# Patient Record
Sex: Female | Born: 1965 | Race: White | Hispanic: No | Marital: Single | Smoking: Former smoker
Health system: Southern US, Community
[De-identification: ages and names within clinical notes are randomized; demographics above are authoritative.]

## PROBLEM LIST (undated history)

## (undated) DIAGNOSIS — J45909 Unspecified asthma, uncomplicated: Secondary | ICD-10-CM

## (undated) DIAGNOSIS — E7800 Pure hypercholesterolemia, unspecified: Secondary | ICD-10-CM

## (undated) DIAGNOSIS — R748 Abnormal levels of other serum enzymes: Secondary | ICD-10-CM

## (undated) DIAGNOSIS — I10 Essential (primary) hypertension: Secondary | ICD-10-CM

## (undated) DIAGNOSIS — K760 Fatty (change of) liver, not elsewhere classified: Secondary | ICD-10-CM

## (undated) DIAGNOSIS — G4730 Sleep apnea, unspecified: Secondary | ICD-10-CM

## (undated) DIAGNOSIS — E559 Vitamin D deficiency, unspecified: Secondary | ICD-10-CM

## (undated) HISTORY — DX: Fatty (change of) liver, not elsewhere classified: K76.0

## (undated) HISTORY — DX: Unspecified asthma, uncomplicated: J45.909

## (undated) HISTORY — DX: Abnormal levels of other serum enzymes: R74.8

## (undated) HISTORY — PX: OTHER SURGICAL HISTORY: SHX169

## (undated) HISTORY — DX: Vitamin D deficiency, unspecified: E55.9

## (undated) HISTORY — PX: APPENDECTOMY: SHX54

## (undated) HISTORY — DX: Pure hypercholesterolemia, unspecified: E78.00

## (undated) HISTORY — PX: ABDOMINAL HERNIA REPAIR: SHX539

---

## 2000-03-09 ENCOUNTER — Other Ambulatory Visit: Admission: RE | Admit: 2000-03-09 | Discharge: 2000-03-09 | Payer: Self-pay | Admitting: Obstetrics and Gynecology

## 2000-06-28 ENCOUNTER — Other Ambulatory Visit: Admission: RE | Admit: 2000-06-28 | Discharge: 2000-06-28 | Payer: Self-pay | Admitting: Obstetrics and Gynecology

## 2002-07-24 ENCOUNTER — Other Ambulatory Visit: Admission: RE | Admit: 2002-07-24 | Discharge: 2002-07-24 | Payer: Self-pay | Admitting: Obstetrics and Gynecology

## 2002-12-29 ENCOUNTER — Inpatient Hospital Stay (HOSPITAL_COMMUNITY): Admission: AD | Admit: 2002-12-29 | Discharge: 2003-01-02 | Payer: Self-pay | Admitting: Obstetrics and Gynecology

## 2003-08-19 ENCOUNTER — Other Ambulatory Visit: Admission: RE | Admit: 2003-08-19 | Discharge: 2003-08-19 | Payer: Self-pay | Admitting: Obstetrics and Gynecology

## 2004-08-24 ENCOUNTER — Other Ambulatory Visit: Admission: RE | Admit: 2004-08-24 | Discharge: 2004-08-24 | Payer: Self-pay | Admitting: Obstetrics and Gynecology

## 2005-08-30 ENCOUNTER — Other Ambulatory Visit: Admission: RE | Admit: 2005-08-30 | Discharge: 2005-08-30 | Payer: Self-pay | Admitting: Obstetrics and Gynecology

## 2005-09-08 ENCOUNTER — Encounter: Admission: RE | Admit: 2005-09-08 | Discharge: 2005-09-08 | Payer: Self-pay | Admitting: Obstetrics and Gynecology

## 2008-01-03 ENCOUNTER — Ambulatory Visit (HOSPITAL_COMMUNITY): Admission: RE | Admit: 2008-01-03 | Discharge: 2008-01-03 | Payer: Self-pay | Admitting: Family Medicine

## 2010-01-14 ENCOUNTER — Emergency Department (HOSPITAL_COMMUNITY): Admission: EM | Admit: 2010-01-14 | Discharge: 2010-01-14 | Payer: Self-pay | Admitting: Emergency Medicine
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
## 2011-01-16 LAB — URINALYSIS, ROUTINE W REFLEX MICROSCOPIC
Bilirubin Urine: NEGATIVE
Glucose, UA: NEGATIVE mg/dL
Ketones, ur: NEGATIVE mg/dL
Leukocytes, UA: NEGATIVE
Nitrite: POSITIVE — AB
Specific Gravity, Urine: 1.025 (ref 1.005–1.030)
Urobilinogen, UA: 0.2 mg/dL (ref 0.0–1.0)
pH: 5.5 (ref 5.0–8.0)

## 2011-01-16 LAB — DIFFERENTIAL
Basophils Absolute: 0 10*3/uL (ref 0.0–0.1)
Basophils Relative: 0 % (ref 0–1)
Eosinophils Absolute: 0.2 10*3/uL (ref 0.0–0.7)
Eosinophils Relative: 2 % (ref 0–5)
Lymphocytes Relative: 12 % (ref 12–46)
Lymphs Abs: 1.6 10*3/uL (ref 0.7–4.0)
Monocytes Absolute: 0.5 10*3/uL (ref 0.1–1.0)
Monocytes Relative: 4 % (ref 3–12)
Neutro Abs: 11.1 10*3/uL — ABNORMAL HIGH (ref 1.7–7.7)
Neutrophils Relative %: 83 % — ABNORMAL HIGH (ref 43–77)

## 2011-01-16 LAB — BASIC METABOLIC PANEL
BUN: 13 mg/dL (ref 6–23)
CO2: 28 mEq/L (ref 19–32)
Calcium: 9.4 mg/dL (ref 8.4–10.5)
Chloride: 101 mEq/L (ref 96–112)
Creatinine, Ser: 0.67 mg/dL (ref 0.4–1.2)
GFR calc Af Amer: >60 mL/min (ref >60)
GFR calc non Af Amer: >60 mL/min (ref >60)
Glucose, Bld: 117 mg/dL — ABNORMAL HIGH (ref 70–99)
Potassium: 3.9 mEq/L (ref 3.5–5.1)
Sodium: 137 mEq/L (ref 135–145)

## 2011-01-16 LAB — CBC
HCT: 39 % (ref 36.0–46.0)
Hemoglobin: 13.7 g/dL (ref 12.0–15.0)
MCHC: 35.1 g/dL (ref 30.0–36.0)
MCV: 97.2 fL (ref 78.0–100.0)
Platelets: 278 10*3/uL (ref 150–400)
RBC: 4.01 MIL/uL (ref 3.87–5.11)
RDW: 12.5 % (ref 11.5–15.5)
WBC: 13.4 10*3/uL — ABNORMAL HIGH (ref 4.0–10.5)

## 2011-01-16 LAB — URINE MICROSCOPIC-ADD ON

## 2011-01-16 LAB — SAMPLE TO BLOOD BANK

## 2011-01-16 LAB — PREGNANCY, URINE: Preg Test, Ur: NEGATIVE

## 2011-03-10 NOTE — Discharge Summary (Signed)
NAME:  KIELEY, AKTER                           ACCOUNT NO.:  [id number]   MEDICAL RECORD NO.:  [id number]                   PATIENT TYPE:  INP   LOCATION:  9136                                 FACILITY:  [facility]   PHYSICIAN:  Gerrit Friends. Aldona Bar, M.D.                DATE OF BIRTH:  1965/10/25   DATE OF ADMISSION:  12/29/2002  DATE OF DISCHARGE:  01/02/2003                                 DISCHARGE SUMMARY   DISCHARGE DIAGNOSES:  1. Term pregnancy delivered 8 pound 2 ounce female infant, Apgars 9/9.  2. Blood type A positive.  3. Failed induction of labor.  4. Positive group B Streptococcus antenatally.  5. Preeclampsia.   PROCEDURE:  Primary low transverse cesarean section.   SUMMARY:  This 45 year old gravida 2, para 0, was admitted at term for  induction secondary to elevated blood pressure.  She was seen in the office  on March 2 with some elevation of her blood pressure noted; a nonstress test  at that time was reactive.  On March 5, her blood pressure was more normal,  and nonstress test was reactive.  Induction was scheduled on the evening of  March 8, at which time she was admitted.  Her blood pressure on admission  was 168/90.  Cervix was a dimple 20% presenting part (vertex) -2.  She did  have 2+ edema, 3+ reflexes, and 1 beat of clonus bilaterally.   Cytotec was placed with onset of good contractions.  On the morning of March  9, cervix was still a dimple, but since the patient had some previous  cryotherapy, and the cervix thinned out significantly with the contractions  the patient was having, it was possible to open the cervix and reduce the  scar tissue, and this was carried out without difficulty.  The patient  thereafter rapidly progressed to complete dilatation and began pushing well.  Unfortunately, the baby was OP, and an attempt to rotate manually was not  possible.  An epidural was placed to allow the patient to relax and to offer  pain relief.  This was carried out,  and again manual rotation was successful  from ROP to ROA, and with 30 more minutes of pushing, the head came down  from +1 to +2 station.  An attempt was made to deliver the patient with low  forceps, but no change in station was noted after two efforts with the  forceps.  The effort to deliver by forceps was abandoned, and the patient  was taken to the operating room for a low transverse cesarean section and  was delivered of an 8 pound 2 ounce female infant with good Apgars.   The patient's postpartum course was benign.  Blood pressure improved.  Other  than a fever spike the night of deliver, the temperature became totally  normal and remained afebrile during her hospital course.  On the morning of  March 12, she was ambulating well, tolerating a regular diet well and had  normal bowel and bladder function, was normotensive.  Her wound was clean  and dry.  Discharge hemoglobin done on March 10 was 9.4 with a white count  of 11,800 and platelet count of 189,000.   On the morning of discharge, her staples were removed; her wound was Steri-  Stripped, and she was given all appropriate instructions and discharged.  The baby had some physiologic jaundice being dealt with by the  pediatricians.   DISCHARGE MEDICATIONS:  1. Vitamins 1 a day as long as patient is breast feeding.  2. Ferrous sulfate 300 mg daily.  3. Motrin 600 mg every 6 hours as needed for pain.  4. Tylox 1 to 2 every 4 to 6 hours as needed for severe pain.   FOLLOW UP:  She will return to the office for followup in approximately four  weeks' time or as needed. She still had some peripheral edema and was  counseled on this.   CONDITION ON DISCHARGE:  Improved.                                               Gerrit Friends. Aldona Bar, M.D.    RMW/MEDQ  D:  01/02/2003  T:  01/03/2003  Job:  [id number]

## 2011-03-10 NOTE — Op Note (Signed)
NAME:  Brianna Lopez, MEN                           ACCOUNT NO.:  [id number]   MEDICAL RECORD NO.:  [id number]                   PATIENT TYPE:  INP   LOCATION:  9163                                 FACILITY:  [facility]   PHYSICIAN:  Malva Limes, M.D.                 DATE OF BIRTH:  1966-08-04   DATE OF PROCEDURE:  12/30/2002  DATE OF DISCHARGE:                                 OPERATIVE REPORT   PREOPERATIVE DIAGNOSES:  1. Intrauterine pregnancy at 25 weeks estimated gestational age.  2. Cephalopelvic disproportion.  3. Pregnancy-induced hypertension.   POSTOPERATIVE DIAGNOSES:  1. Intrauterine pregnancy at 29 weeks estimated gestational age.  2. Cephalopelvic disproportion.  3. Pregnancy-induced hypertension.   OPERATION PERFORMED:  Primary low transverse cesarean.   SURGEON:  Malva Limes, M.D.   ANESTHESIA:  Epidural.   ANTIBIOTICS:  Ancef 1 g.   ESTIMATED BLOOD LOSS:  900 ml.   COMPLICATIONS:  None.   SPECIMENS:  None.   FINDINGS:  The patient had normal fallopian tubes, ovaries and uterus.  The  patient delivered one viable white female infant with Apgars 9 at one minute  and 9 at five minutes.  The weight was eight pounds and two ounces.   DESCRIPTION OF PROCEDURE:  The patient was taken to the operating room.  Once an adequate anesthetic level was reached, the patient was prepped with  Hibiclens and draped in the usual fashion for this procedure.  A Foley  catheter had previously been placed.  A Pfannenstiel incision was made.  This was carried down to fascia.  The fascia was entered in the midline,  extended laterally.  The rectus muscles were then dissected from the fascia  with the Bovie.  The rectus muscles were divided in the midline and taken  superiorly and inferiorly.  The parietal peritoneum was entered sharply and  taken superiorly and inferiorly.  The bladder flap was taken down sharply.  A low transverse  uterine incision was made in the midline and  extended  laterally with blunt dissection.  Amniotic sac was entered sharply.  The  fluid was noted to be clear.  The infant was delivered in a vertex  presentation.  On delivery of the head, the oropharynx and mouth were bulb  suctioned.  The remainder of the infant was then delivered.  The cord was  doubly clamped and cut.  The infant handed to the waiting NICU team.  Cord  blood was then obtained.  The placenta was manually removed.  The uterus was  exteriorized.  The uterine cavity was wiped with a wet lap.  The uterine  incision was closed in a single layer of 0 chromic in a running locking  fashion.  The bladder flap was closed using 3-0 chromic in a running  fashion.  The uterus was placed back in the abdominal cavity.  The gutters  were wiped with a  wet lap.  Hemostasis was checked and felt to be adequate.  The parietal peritoneum and the rectus muscles were reapproximated in the  midline using 3-0 chromic in a running fashion.  The fascia was closed using  0 Monocryl suture in a running fashion.  The subcuticular tissue was made  hemostatic with a Bovie.  Stainless steel clips were used to close the skin.  The patient tolerated the procedure well.  She was taken to the recovery  room in stable condition.  Instrument and lap counts were correct times two.                                                Malva Limes, M.D.    MA/MEDQ  D:  12/30/2002  T:  12/30/2002  Job:  [id number]

## 2014-09-16 ENCOUNTER — Other Ambulatory Visit (HOSPITAL_COMMUNITY): Payer: Self-pay | Admitting: Family Medicine

## 2014-09-16 DIAGNOSIS — R7989 Other specified abnormal findings of blood chemistry: Secondary | ICD-10-CM

## 2014-09-16 DIAGNOSIS — R945 Abnormal results of liver function studies: Principal | ICD-10-CM

## 2014-09-23 ENCOUNTER — Ambulatory Visit (HOSPITAL_COMMUNITY): Payer: Self-pay

## 2014-10-26 ENCOUNTER — Other Ambulatory Visit (HOSPITAL_COMMUNITY): Payer: Self-pay

## 2014-10-27 ENCOUNTER — Ambulatory Visit (HOSPITAL_COMMUNITY)
Admission: RE | Admit: 2014-10-27 | Discharge: 2014-10-27 | Disposition: A | Discharge status: Home / Self Care | Payer: BC Managed Care – PPO | Source: Ambulatory Visit | Attending: Family Medicine | Admitting: Family Medicine

## 2014-10-27 DIAGNOSIS — R7989 Other specified abnormal findings of blood chemistry: Secondary | ICD-10-CM | POA: Insufficient documentation

## 2014-10-27 DIAGNOSIS — K760 Fatty (change of) liver, not elsewhere classified: Secondary | ICD-10-CM | POA: Diagnosis not present

## 2014-10-27 DIAGNOSIS — R945 Abnormal results of liver function studies: Secondary | ICD-10-CM

## 2016-09-29 IMAGING — US US ABDOMEN COMPLETE
1 series · 14 of 25 positions shown · non-contrast
Comparison: None.

CLINICAL DATA: 48-year-old female with abnormal LFTs. Initial
encounter.

EXAM:
ULTRASOUND ABDOMEN COMPLETE

[Series 1: us abdomen complete · 0.18mm/px · 14 of 107 slices shown]
[im 1/107]
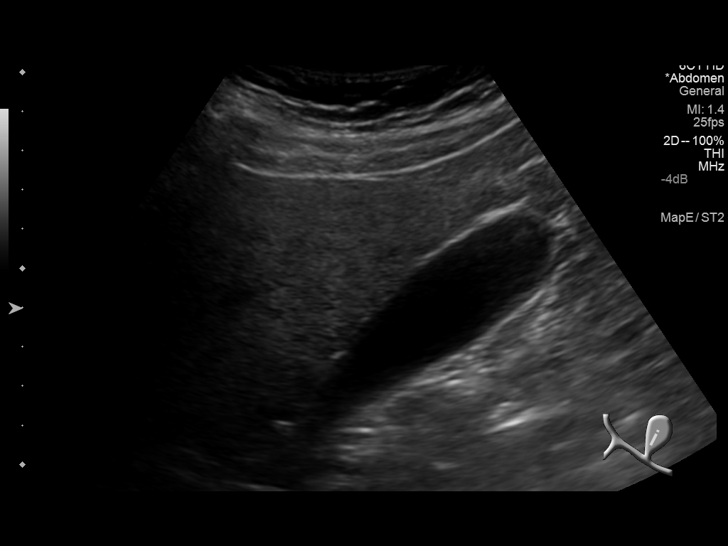
[im 9/107]
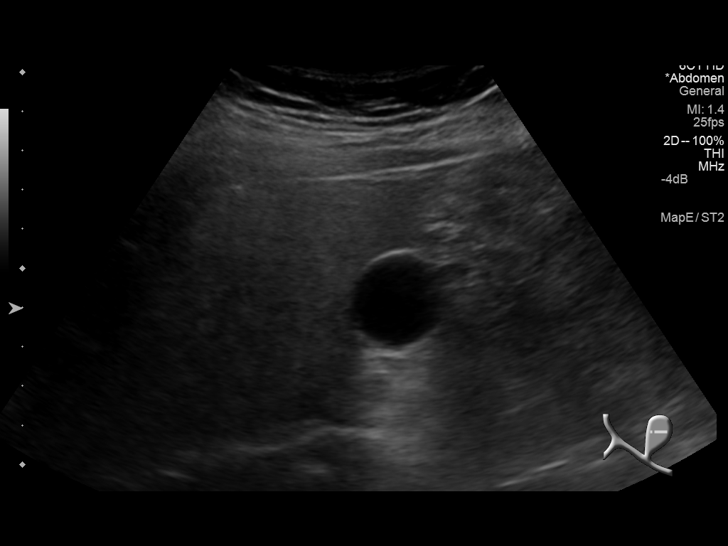
[im 18/107]
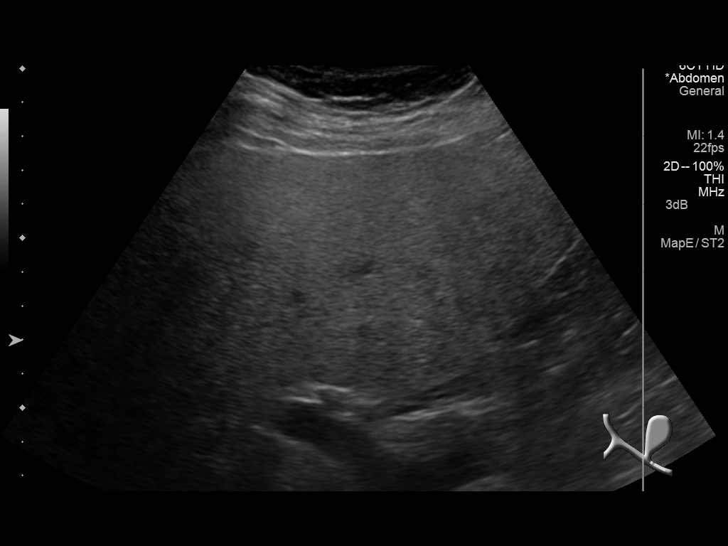
[im 27/107]
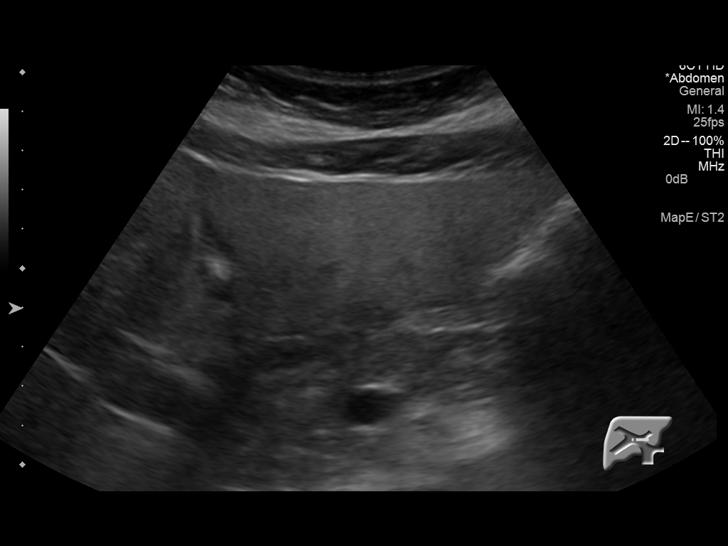
[im 36/107]
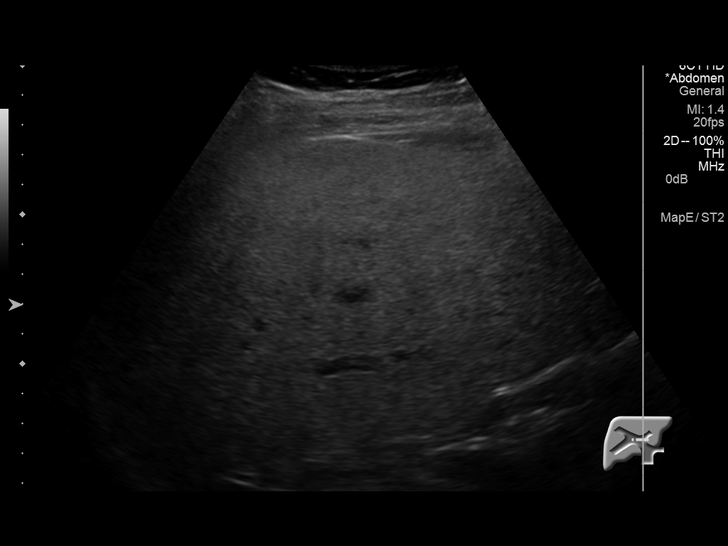
[im 40/107]
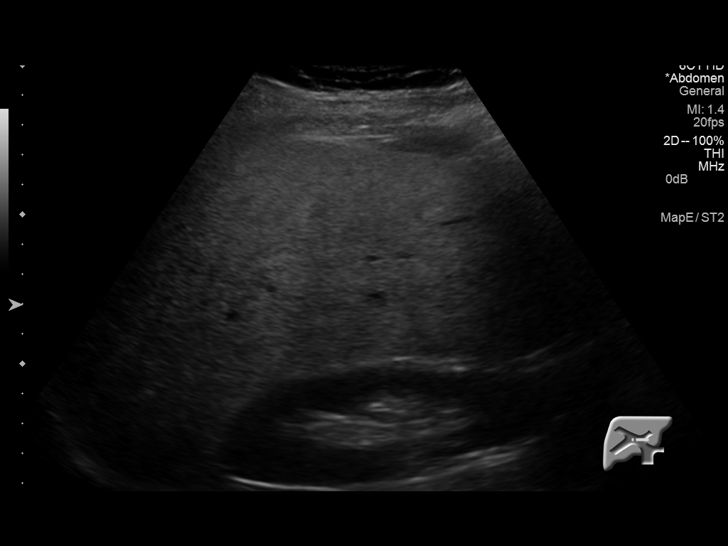
[im 49/107]
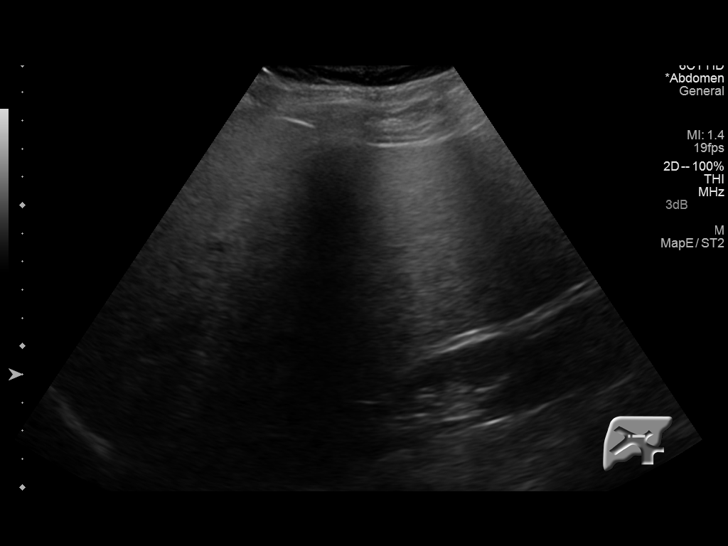
[im 58/107]
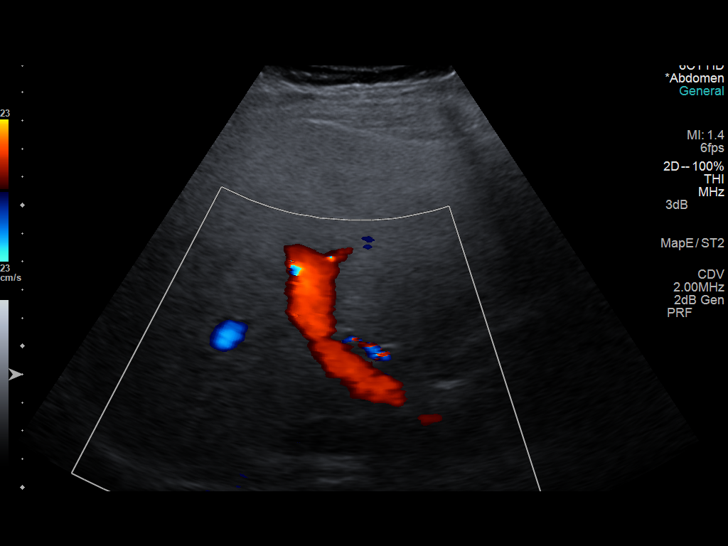
[im 67/107]
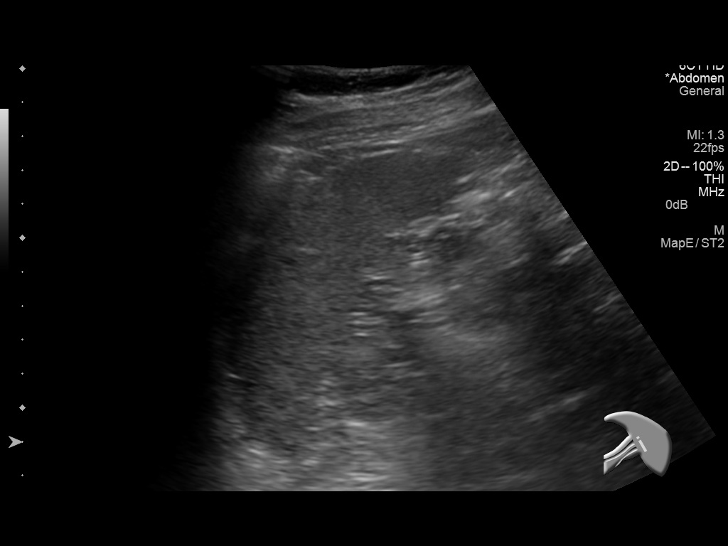
[im 71/107]
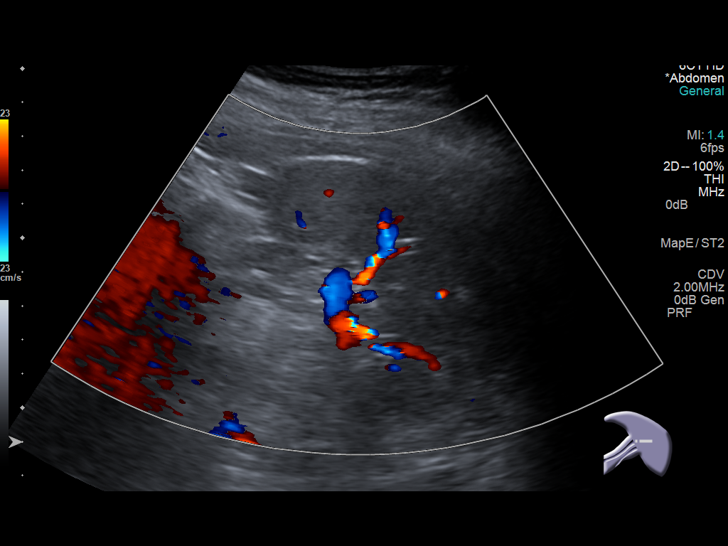
[im 80/107]
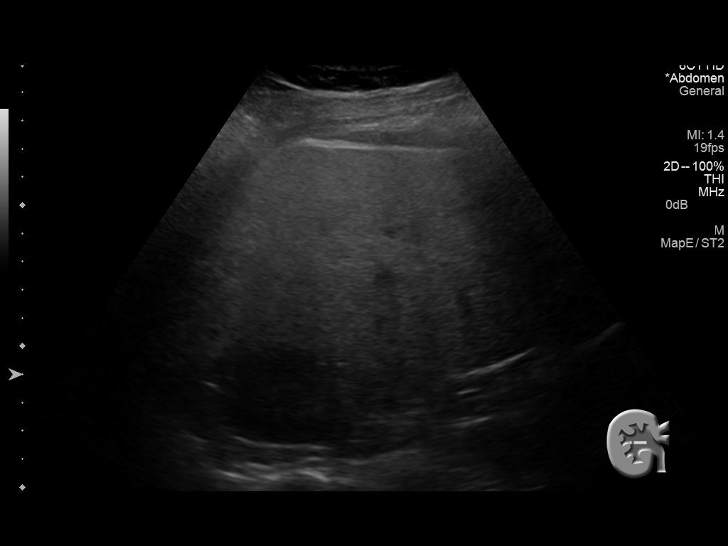
[im 89/107]
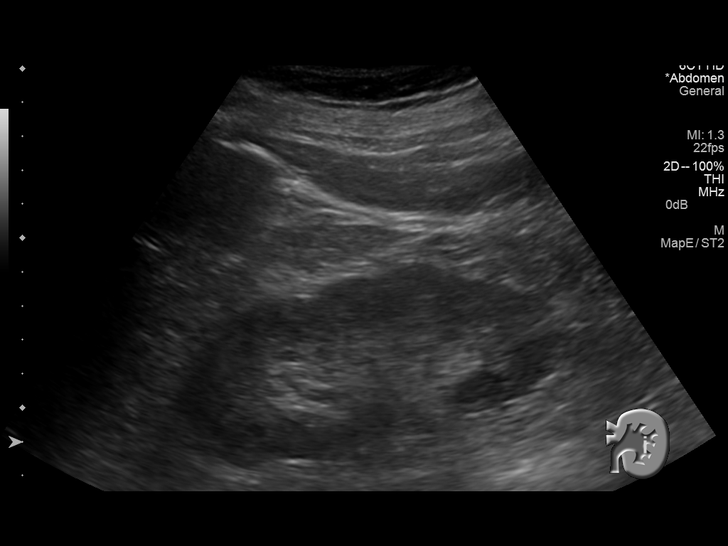
[im 98/107]
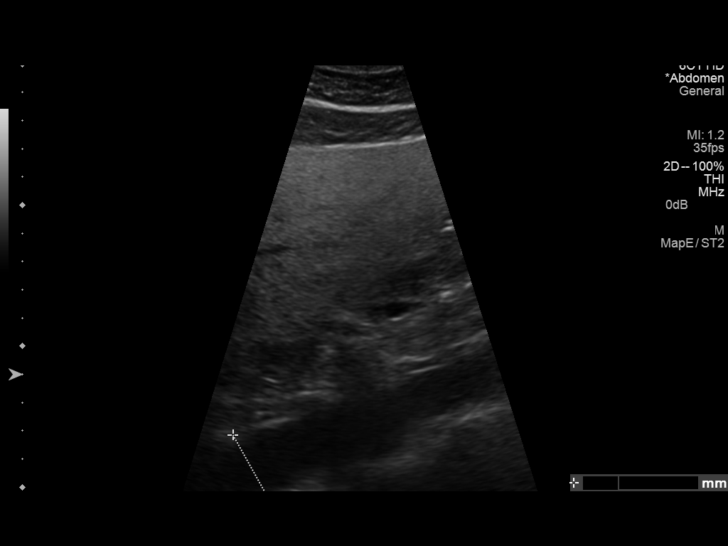
[im 107/107]
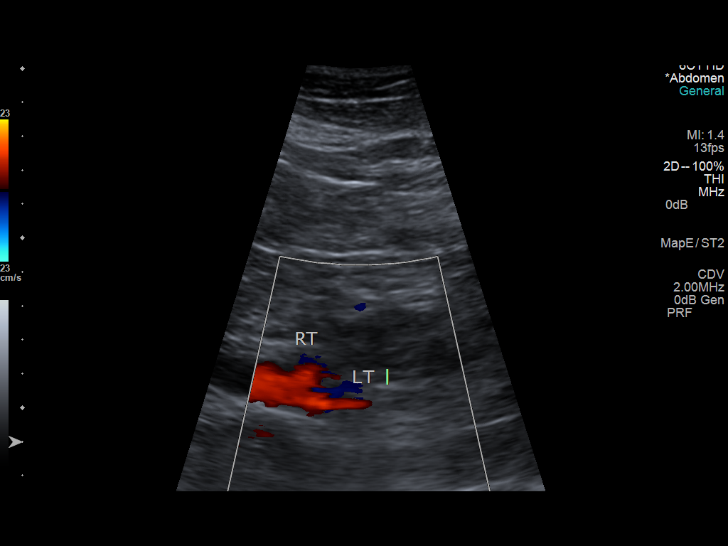

[14 of 25 positions shown; findings below may reference images not displayed]

FINDINGS: Gallbladder: No gallstones or wall thickening visualized. No
sonographic Murphy sign noted.

Common bile duct: Diameter: 4 mm, normal

Liver: Diffusely increased echogenicity. Mildly heterogeneous
echotexture. No discrete liver lesion. No intrahepatic biliary
ductal dilatation.

IVC: No abnormality visualized.

Pancreas: Visualized portion unremarkable.

Spleen: Size and appearance within normal limits.

Right Kidney: Length: 12.3 cm. Echogenicity within normal limits. No
mass or hydronephrosis visualized.

Left Kidney: Length: 12.9 cm. Echogenicity within normal limits. No
mass or hydronephrosis visualized.

Abdominal aorta: No aneurysm visualized.

Other findings: None.
IMPRESSION: Hepatic steatosis.

## 2022-06-28 ENCOUNTER — Ambulatory Visit (INDEPENDENT_AMBULATORY_CARE_PROVIDER_SITE_OTHER): Payer: BC Managed Care – PPO | Admitting: Internal Medicine

## 2022-06-28 VITALS — BP 141/83 | HR 85 | Resp 16 | Ht 69.0 in | Wt 224.0 lb

## 2022-06-28 DIAGNOSIS — E669 Obesity, unspecified: Secondary | ICD-10-CM | POA: Diagnosis not present

## 2022-06-28 DIAGNOSIS — F419 Anxiety disorder, unspecified: Secondary | ICD-10-CM | POA: Diagnosis not present

## 2022-06-28 DIAGNOSIS — I10 Essential (primary) hypertension: Secondary | ICD-10-CM | POA: Diagnosis not present

## 2022-06-28 DIAGNOSIS — G4710 Hypersomnia, unspecified: Secondary | ICD-10-CM

## 2022-06-28 DIAGNOSIS — F32A Depression, unspecified: Secondary | ICD-10-CM | POA: Insufficient documentation

## 2022-06-28 NOTE — Progress Notes (Signed)
Sleep Medicine   Office Visit  Patient Name: Brianna Lopez DOB: [birth year] MRN [id number]    Chief Complaint: Sleep consult  Brief History:  Brianna Lopez presents with at least a one year history of loud snoring and lethargy during the day.  Patient reports her sleep quality is poor due to loud snoring every night and feeling tired all day. This is noted most nights. The patient's bed partner reports  loud snoring and witnessed apneas at night. The patient relates the following symptoms: snoring that wakes her, chronic yawning during the day, reduced energy level, brain fog, lack of focus and daytime fatigue are also present. The patient goes to sleep at 10-10:30pm and wakes up at 7:00am.  Sleep quality is the same when outside home environment.  Patient has noted no restlessness of her legs at night.  The patient  relates vivid dreams and sleep walking  as occasional unusual behavior during the night.  The patient anxiety and depression as a history of psychiatric problems. The Epworth Sleepiness Score is 3 out of 24 .  The patient relates  Cardiovascular risk factors include: HTN. The patient reports family and friends have told her that her snoring is so loud they can't be in the same room with her. STOP BANG SCORE-6.   ROS  General: (-) fever, (-) chills, (-) night sweat Nose and Sinuses: (-) nasal stuffiness or itchiness, (-) postnasal drip, (-) nosebleeds, (-) sinus trouble. Mouth and Throat: (-) sore throat, (-) hoarseness. Neck: (-) swollen glands, (-) enlarged thyroid, (-) neck pain. Respiratory: - cough, - shortness of breath, - wheezing. Neurologic: - numbness, - tingling. Psychiatric: + anxiety, + depression Sleep behavior: -sleep paralysis -hypnogogic hallucinations -dream enactment      +vivid dreams -cataplexy -night terrors +sleep walking   Current Medication: Outpatient Encounter Medications as of 06/28/2022  Medication Sig   venlafaxine XR (EFFEXOR-XR) 75 MG 24 hr capsule  Take 75 mg by mouth daily with breakfast.   amLODipine (NORVASC) 5 MG tablet Take by mouth.   Cholecalciferol 25 MCG (1000 UT) capsule Take by mouth.   hydrochlorothiazide (HYDRODIURIL) 25 MG tablet Take by mouth.   losartan (COZAAR) 100 MG tablet Take by mouth.   rosuvastatin (CRESTOR) 10 MG tablet Take by mouth.   venlafaxine XR (EFFEXOR-XR) 150 MG 24 hr capsule Take by mouth.   No facility-administered encounter medications on file as of 06/28/2022.    Surgical History: History reviewed. No pertinent surgical history.  Medical History: History reviewed. No pertinent past medical history.  Family History: Non contributory to the present illness  Social History: Social History   Socioeconomic History   Marital status: Single    Spouse name: Not on file   Number of children: Not on file   Years of education: Not on file   Highest education level: Not on file  Occupational History   Not on file  Tobacco Use   Smoking status: Former    Types: Cigarettes   Smokeless tobacco: Never  Substance and Sexual Activity   Alcohol use: Yes   Drug use: Not on file   Sexual activity: Not on file  Other Topics Concern   Not on file  Social History Narrative   Not on file   Social Determinants of Health   Financial Resource Strain: Not on file  Food Insecurity: Not on file  Transportation Needs: Not on file  Physical Activity: Not on file  Stress: Not on file  Social Connections: Not on file  Intimate Partner Violence: Not on file    Vital Signs: Blood pressure (!) 141/83, pulse 85, resp. rate 16, height 5\' 9"  (1.753 m), weight 224 lb (101.6 kg), SpO2 98 %. Body mass index is 33.08 kg/m.   Examination: General Appearance: The patient is well-developed, well-nourished, and in no distress. Neck Circumference: 42cm Skin: Gross inspection of skin unremarkable. Head: normocephalic, no gross deformities. Eyes: no gross deformities noted. ENT: ears appear grossly  normal Neurologic: Alert and oriented. No involuntary movements.    STOP BANG RISK ASSESSMENT S (snore) Have you been told that you snore?     YES   T (tired) Are you often tired, fatigued, or sleepy during the day?   YES  O (obstruction) Do you stop breathing, choke, or gasp during sleep? YES   P (pressure) Do you have or are you being treated for high blood pressure? YES   B (BMI) Is your body index greater than 35 kg/m? NO   A (age) Are you 102 years old or older? YES   N (neck) Do you have a neck circumference greater than 16 inches?   YES   G (gender) Are you a female? NO   TOTAL STOP/BANG "YES" ANSWERS 6                                                               A STOP-Bang score of 2 or less is considered low risk, and a score of 5 or more is high risk for having either moderate or severe OSA. For people who score 3 or 4, doctors may need to perform further assessment to determine how likely they are to have OSA.         EPWORTH SLEEPINESS SCALE:  Scale:  (0)= no chance of dozing; (1)= slight chance of dozing; (2)= moderate chance of dozing; (3)= high chance of dozing  Chance  Situtation    Sitting and reading: 0    Watching TV: 1    Sitting Inactive in public: 0    As a passenger in car: 0      Lying down to rest: 2    Sitting and talking: 0    Sitting quielty after lunch: 0    In a car, stopped in traffic: 0   TOTAL SCORE:   3 out of 24    SLEEP STUDIES:  None   LABS: No results found for this or any previous visit (from the past 2160 hour(s)).  Radiology: 2161 Abdomen Complete  Result Date: 10/27/2014 CLINICAL DATA:  56 year old female with abnormal LFTs. Initial encounter. EXAM: ULTRASOUND ABDOMEN COMPLETE COMPARISON:  None. FINDINGS: Gallbladder: No gallstones or wall thickening visualized. No sonographic Murphy sign noted. Common bile duct: Diameter: 4 mm, normal Liver: Diffusely increased echogenicity. Mildly heterogeneous echotexture.  No discrete liver lesion. No intrahepatic biliary ductal dilatation. IVC: No abnormality visualized. Pancreas: Visualized portion unremarkable. Spleen: Size and appearance within normal limits. Right Kidney: Length: 12.3 cm. Echogenicity within normal limits. No mass or hydronephrosis visualized. Left Kidney: Length: 12.9 cm. Echogenicity within normal limits. No mass or hydronephrosis visualized. Abdominal aorta: No aneurysm visualized. Other findings: None. IMPRESSION: Hepatic steatosis. Electronically Signed   By: 42 M.D.   On: 10/27/2014 09:35    No results found.  No  results found.    Assessment and Plan: Patient Active Problem List   Diagnosis Date Noted   Essential hypertension 06/28/2022   Anxiety and depression 06/28/2022     PLAN OSA:   Patient evaluation suggests high risk of sleep disordered breathing due to loud snoring that wakes her, witnessed apneas, chronic yawning during the day, reduced energy level, brain fog, lack of focus and daytime fatigue, and elevated BMI. Patient has comorbid cardiovascular risk factors including: HTN which could be exacerbated by pathologic sleep-disordered breathing.  Suggest: HST to assess/treat the patient's sleep disordered breathing. Pt is nervous about in-lab and would prefer HST instead. The patient was also counselled on wt loss to optimize sleep health.   1. Hypersomnia Will order HST  2. Essential hypertension Continue current medication and f/u with PCP.  3. Anxiety and depression Continue current medication and f/u with PCP.  4. Obesity (BMI 30.0-34.9) Obesity Counseling: Had a lengthy discussion regarding patients BMI and weight issues. Patient was instructed on portion control as well as increased activity. Also discussed caloric restrictions with trying to maintain intake less than 2000 Kcal. Discussions were made in accordance with the 5As of weight management. Simple actions such as not eating late and if able to,  taking a walk is suggested.    General Counseling: I have discussed the findings of the evaluation and examination with Brianna Lopez.  I have also discussed any further diagnostic evaluation thatmay be needed or ordered today. Brianna Lopez verbalizes understanding of the findings of todays visit. We also reviewed her medications today and discussed drug interactions and side effects including but not limited excessive drowsiness and altered mental states. We also discussed that there is always a risk not just to her but also people around her. she has been encouraged to call the office with any questions or concerns that should arise related to todays visit.  No orders of the defined types were placed in this encounter.       I have personally obtained a history, evaluated the patient, evaluated pertinent data, formulated the assessment and plan and placed orders.  This patient was seen by Lynn Ito, PA-C in collaboration with Dr. Freda Munro as a part of collaborative care agreement.    Yevonne Pax, MD [facility] Diplomate ABMS Pulmonary and Critical Care Medicine Sleep medicine

## 2022-06-28 NOTE — Patient Instructions (Signed)
Hypersomnia Hypersomnia is a condition in which a person feels very tired during the day even though the person gets plenty of sleep at night. A person with this condition may take naps during the day and may find it very difficult to wake up from sleep. Hypersomnia may affect a person's ability to think, concentrate, drive, or remember things. What are the causes? The cause of this condition may not be known. Possible causes include: Taking certain medicines. Using drugs or alcohol. Sleep disorders, such as narcolepsy and sleep apnea. Injury to the head, brain, or spinal cord. Tumors. Certain medical conditions. These include: Depression. Diabetes. Gastroesophageal reflux disease (GERD). An underactive thyroid gland (hypothyroidism). What are the signs or symptoms? The main symptoms of hypersomnia include: Feeling very tired throughout the day, regardless of how much sleep you got the night before. Having trouble waking up. Others may find it difficult to wake you up when you are sleeping. Sleeping for longer and longer periods at a time. Taking naps throughout the day. Other symptoms may include: Feeling restless, anxious, or annoyed. Lacking energy. Having trouble with: Remembering. Speaking. Thinking. Loss of appetite. Seeing, hearing, tasting, smelling, or feeling things that are not real (hallucinations). How is this diagnosed? This condition may be diagnosed based on: Your symptoms and medical history. Your sleeping habits. Your health care provider may ask you to write down your sleeping habits in a daily sleep log, along with any symptoms you have. A series of tests that are done while you sleep (sleep study or polysomnogram). A test that measures how quickly you can fall asleep during the day (daytime nap study or multiple sleep latency test). How is this treated? This condition may be treated by: Following a regular sleep routine. Making lifestyle changes, such as  changing your eating habits, getting regular exercise, and avoiding alcohol or caffeinated beverages. Taking medicines to make you more alert (stimulants) during the day. Treating any underlying medical causes of hypersomnia. Follow these instructions at home: Sleep habits Stick to a routine that includes going to bed and waking up at the same times every day and night. Practice a relaxing bedtime routine. This may include reading, meditation, deep breathing, or taking a warm bath before going to sleep. Exercise regularly as told by your health care provider. However, avoid exercising in the hours right before bedtime. Keep your sleep environment at a cooler temperature, darkened, and quiet. Sleep with pillows and a mattress that are comfortable and supportive. Schedule short 20-minute naps for when you feel sleepiest during the day. Talk with your employer or teachers about your hypersomnia. If possible, adjust your schedule so that: You have a regular daytime work schedule. You can take a scheduled nap during the day. You do not have to work or be active at night. Do not eat a heavy meal for a few hours before bedtime. Eat your meals at about the same times every day. Safety  Do not drive or use machinery if you are sleepy. Ask your health care provider if it is safe for you to drive. Wear a life jacket when swimming or spending time near water. General instructions  Take over-the-counter and prescription medicines only as told by your health care provider. This includes supplements. Avoid drinking alcohol or caffeinated beverages. Keep a sleep log that will help your health care provider manage your condition. This may include information about: What time you go to bed each night. How often you wake up at night. How many hours   you sleep at night. How often and for how long you nap during the day. Any observations from others, such as leg movements during sleep, sleep walking, or  snoring. Keep all follow-up visits. This is important. Contact a health care provider if: You have new symptoms. Your symptoms get worse. Get help right away if: You have thoughts about hurting yourself or someone else. Get help right away if you feel like you may hurt yourself or others, or have thoughts about taking your own life. Go to your nearest emergency room or: Call 911. Call the National Suicide Prevention Lifeline at 1-800-273-8255 or 988. This is open 24 hours a day. Text the Crisis Text Line at 741741. Summary Hypersomnia refers to a condition in which you feel very tired during the day even though you get plenty of sleep at night. A person with this condition may take naps during the day and may find it very difficult to wake up from sleep. Hypersomnia may affect a person's ability to think, concentrate, drive, or remember things. Treatment may include a regular sleep routine and making some lifestyle changes. This information is not intended to replace advice given to you by your health care provider. Make sure you discuss any questions you have with your health care provider. Document Revised: 09/19/2021 Document Reviewed: 09/19/2021 Elsevier Patient Education  2023 [employer]  

## 2022-10-09 ENCOUNTER — Ambulatory Visit (INDEPENDENT_AMBULATORY_CARE_PROVIDER_SITE_OTHER): Payer: BC Managed Care – PPO | Admitting: Internal Medicine

## 2022-10-09 VITALS — BP 139/85 | HR 85 | Resp 16 | Ht 68.0 in | Wt 220.0 lb

## 2022-10-09 DIAGNOSIS — G4733 Obstructive sleep apnea (adult) (pediatric): Secondary | ICD-10-CM

## 2022-10-09 DIAGNOSIS — I10 Essential (primary) hypertension: Secondary | ICD-10-CM

## 2022-10-09 DIAGNOSIS — E669 Obesity, unspecified: Secondary | ICD-10-CM | POA: Diagnosis not present

## 2022-10-09 NOTE — Patient Instructions (Signed)

## 2022-10-09 NOTE — Progress Notes (Signed)
Sleep Medicine   Office Visit  Patient Name: Brianna Lopez DOB: [birth year] MRN [id number]    Chief Complaint: sleep study results   Brief History:  Ellene presents with a 3 month history of sleep apnea.  Sleep quality is poor due to loud snoring and feeling tired all day. This is noted most nights. The patient's bed partner reports  loud snoring and witnessed apnea at night. The patient relates the following symptoms: Loud snoring, waking up gasping, daytime sleepiness, and brain fog are also present. The patient goes to sleep at 10pm and wakes up at 6:50am.  Sleep quality is worse when outside home environment.  Patient has noted restlessness of her legs at night.  The patient  relates a history of sleepwalking behavior during the night.  The patient reports a history of psychiatric problems. The Epworth Sleepiness Score is improved 0 out of 24 .  The patient relates  Cardiovascular risk factors include: hypertension.     ROS  General: (-) fever, (-) chills, (-) night sweat Nose and Sinuses: (-) nasal stuffiness or itchiness, (-) postnasal drip, (-) nosebleeds, (-) sinus trouble. Mouth and Throat: (-) sore throat, (-) hoarseness. Neck: (-) swollen glands, (-) enlarged thyroid, (-) neck pain. Respiratory: - cough, - shortness of breath, - wheezing. Neurologic: - numbness, - tingling. Psychiatric: + anxiety, + depression Sleep behavior: -sleep paralysis -hypnogogic hallucinations -dream enactment      -vivid dreams -cataplexy -night terrors -sleep walking   Current Medication: Outpatient Encounter Medications as of 10/09/2022  Medication Sig   amLODipine (NORVASC) 5 MG tablet Take by mouth.   Cholecalciferol 25 MCG (1000 UT) capsule Take by mouth.   hydrochlorothiazide (HYDRODIURIL) 25 MG tablet Take by mouth.   losartan (COZAAR) 100 MG tablet Take by mouth.   rosuvastatin (CRESTOR) 10 MG tablet Take by mouth.   venlafaxine XR (EFFEXOR-XR) 150 MG 24 hr capsule Take by mouth.    venlafaxine XR (EFFEXOR-XR) 75 MG 24 hr capsule Take 75 mg by mouth daily with breakfast.   No facility-administered encounter medications on file as of 10/09/2022.    Surgical History: History reviewed. No pertinent surgical history.  Medical History: History reviewed. No pertinent past medical history.  Family History: Non contributory to the present illness  Social History: Social History   Socioeconomic History   Marital status: Single    Spouse name: Not on file   Number of children: Not on file   Years of education: Not on file   Highest education level: Not on file  Occupational History   Not on file  Tobacco Use   Smoking status: Former    Types: Cigarettes   Smokeless tobacco: Never  Substance and Sexual Activity   Alcohol use: Yes   Drug use: Not on file   Sexual activity: Not on file  Other Topics Concern   Not on file  Social History Narrative   Not on file   Social Determinants of Health   Financial Resource Strain: Not on file  Food Insecurity: Not on file  Transportation Needs: Not on file  Physical Activity: Not on file  Stress: Not on file  Social Connections: Not on file  Intimate Partner Violence: Not on file    Vital Signs: Blood pressure 139/85, pulse 85, resp. rate 16, height 5\' 8"  (1.727 m), weight 220 lb (99.8 kg), SpO2 97 %. Body mass index is 33.45 kg/m.   Examination: General Appearance: The patient is well-developed, well-nourished, and in no distress. Neck Circumference:  42 cm Skin: Gross inspection of skin unremarkable. Head: normocephalic, no gross deformities. Eyes: no gross deformities noted. ENT: ears appear grossly normal Neurologic: Alert and oriented. No involuntary movements.    STOP BANG RISK ASSESSMENT S (snore) Have you been told that you snore?     YES   T (tired) Are you often tired, fatigued, or sleepy during the day?   NO  O (obstruction) Do you stop breathing, choke, or gasp during sleep? YES   P  (pressure) Do you have or are you being treated for high blood pressure? YES   B (BMI) Is your body index greater than 35 kg/m? NO   A (age) Are you 24 years old or older? YES   N (neck) Do you have a neck circumference greater than 16 inches?   YES   G (gender) Are you a female? NO   TOTAL STOP/BANG "YES" ANSWERS 5                                                               A STOP-Bang score of 2 or less is considered low risk, and a score of 5 or more is high risk for having either moderate or severe OSA. For people who score 3 or 4, doctors may need to perform further assessment to determine how likely they are to have OSA.         EPWORTH SLEEPINESS SCALE:  Scale:  (0)= no chance of dozing; (1)= slight chance of dozing; (2)= moderate chance of dozing; (3)= high chance of dozing  Chance  Situtation    Sitting and reading: 0    Watching TV: 0    Sitting Inactive in public: 0    As a passenger in car: 0      Lying down to rest: 0    Sitting and talking: 0    Sitting quielty after lunch: 0    In a car, stopped in traffic: 0   TOTAL SCORE:   0 out of 24    SLEEP STUDIES:  HST (07/11/22) REI 17, min SPO2 70%   LABS: No results found for this or any previous visit (from the past 2160 hour(s)).  Radiology: US Abdomen Complete  Result Date: 10/27/2014 CLINICAL DATA:  56 year old female with abnormal LFTs. Initial encounter. EXAM: ULTRASOUND ABDOMEN COMPLETE COMPARISON:  None. FINDINGS: Gallbladder: No gallstones or wall thickening visualized. No sonographic Murphy sign noted. Common bile duct: Diameter: 4 mm, normal Liver: Diffusely increased echogenicity. Mildly heterogeneous echotexture. No discrete liver lesion. No intrahepatic biliary ductal dilatation. IVC: No abnormality visualized. Pancreas: Visualized portion unremarkable. Spleen: Size and appearance within normal limits. Right Kidney: Length: 12.3 cm. Echogenicity within normal limits. No mass or  hydronephrosis visualized. Left Kidney: Length: 12.9 cm. Echogenicity within normal limits. No mass or hydronephrosis visualized. Abdominal aorta: No aneurysm visualized. Other findings: None. IMPRESSION: Hepatic steatosis. Electronically Signed   By: Augusto Gamble M.D.   On: 10/27/2014 09:35    No results found.  No results found.    Assessment and Plan: Patient Active Problem List   Diagnosis Date Noted   Essential hypertension 06/28/2022   Anxiety and depression 06/28/2022   1. OSA (obstructive sleep apnea) Pt has AHI of 17 on Home sleep study of 07-11-2022, she has  witnessed apnea, snoring, gasping. Recommend: start APAP at 5-20 cm. She is not interested in titration at this time, due to wanting to start treatment as soon as possible. F/u 30d after set up  2. Essential hypertension Hypertension Counseling:   The following hypertensive lifestyle modification were recommended and discussed:  1. Limiting alcohol intake to less than 1 oz/day of ethanol:(24 oz of beer or 8 oz of wine or 2 oz of 100-proof whiskey). 2. Take baby ASA 81 mg daily. 3. Importance of regular aerobic exercise and losing weight. 4. Reduce dietary saturated fat and cholesterol intake for overall cardiovascular health. 5. Maintaining adequate dietary potassium, calcium, and magnesium intake. 6. Regular monitoring of the blood pressure. 7. Reduce sodium intake to less than 100 mmol/day (less than 2.3 gm of sodium or less than 6 gm of sodium choride)    3. Obesity (BMI 30.0-34.9) Obesity Counseling: Had a lengthy discussion regarding patients BMI and weight issues. Patient was instructed on portion control as well as increased activity. Also discussed caloric restrictions with trying to maintain intake less than 2000 Kcal. Discussions were made in accordance with the 5As of weight management. Simple actions such as not eating late and if able to, taking a walk is suggested.      General Counseling: I have discussed  the findings of the evaluation and examination with Flornce.  I have also discussed any further diagnostic evaluation thatmay be needed or ordered today. Madelon verbalizes understanding of the findings of todays visit. We also reviewed her medications today and discussed drug interactions and side effects including but not limited excessive drowsiness and altered mental states. We also discussed that there is always a risk not just to her but also people around her. she has been encouraged to call the office with any questions or concerns that should arise related to todays visit.  No orders of the defined types were placed in this encounter.       I have personally obtained a history, evaluated the patient, evaluated pertinent data, formulated the assessment and plan and placed orders.  This patient was seen today by Emmaline Kluver, PA-C in collaboration with Dr. Freda Munro.    Yevonne Pax, MD [facility] Diplomate ABMS Pulmonary and Critical Care Medicine Sleep medicine

## 2022-11-07 DIAGNOSIS — W108XXA Fall (on) (from) other stairs and steps, initial encounter: Secondary | ICD-10-CM | POA: Insufficient documentation

## 2022-11-07 DIAGNOSIS — S59912A Unspecified injury of left forearm, initial encounter: Secondary | ICD-10-CM | POA: Diagnosis present

## 2022-11-07 DIAGNOSIS — S0285XA Fracture of orbit, unspecified, initial encounter for closed fracture: Secondary | ICD-10-CM | POA: Diagnosis not present

## 2022-11-07 DIAGNOSIS — I10 Essential (primary) hypertension: Secondary | ICD-10-CM | POA: Insufficient documentation

## 2022-11-07 DIAGNOSIS — S52532A Colles' fracture of left radius, initial encounter for closed fracture: Secondary | ICD-10-CM | POA: Insufficient documentation

## 2022-11-07 DIAGNOSIS — Z87891 Personal history of nicotine dependence: Secondary | ICD-10-CM | POA: Diagnosis not present

## 2022-11-08 ENCOUNTER — Emergency Department (HOSPITAL_COMMUNITY): Payer: BC Managed Care – PPO

## 2022-11-08 ENCOUNTER — Other Ambulatory Visit: Payer: Self-pay

## 2022-11-08 ENCOUNTER — Encounter (HOSPITAL_COMMUNITY): Payer: Self-pay | Admitting: Emergency Medicine

## 2022-11-08 ENCOUNTER — Emergency Department (HOSPITAL_COMMUNITY)
Admission: EM | Admit: 2022-11-08 | Discharge: 2022-11-08 | Disposition: A | Discharge status: Home / Self Care | Payer: BC Managed Care – PPO | Attending: Emergency Medicine | Admitting: Emergency Medicine

## 2022-11-08 DIAGNOSIS — S52532A Colles' fracture of left radius, initial encounter for closed fracture: Secondary | ICD-10-CM

## 2022-11-08 DIAGNOSIS — S0285XA Fracture of orbit, unspecified, initial encounter for closed fracture: Secondary | ICD-10-CM

## 2022-11-08 HISTORY — DX: Essential (primary) hypertension: I10

## 2022-11-08 MED ORDER — OXYCODONE-ACETAMINOPHEN 5-325 MG PO TABS: 1.0000 | ORAL_TABLET | Freq: Four times a day (QID) | ORAL | 0 refills | Status: DC | PRN

## 2022-11-08 MED ORDER — KETAMINE HCL 50 MG/5ML IJ SOSY
1.0000 mg/kg | PREFILLED_SYRINGE | Freq: Once | INTRAMUSCULAR | Status: AC
  Administered 2022-11-08: 98 mg via INTRAVENOUS
  Filled 2022-11-08: qty 10

## 2022-11-08 MED ORDER — HYDROMORPHONE HCL 1 MG/ML IJ SOLN
1.0000 mg | Freq: Once | INTRAMUSCULAR | Status: AC
  Administered 2022-11-08: 1 mg via INTRAVENOUS
  Filled 2022-11-08: qty 1

## 2022-11-08 NOTE — ED Triage Notes (Signed)
Pt states she tripped going up a flight of stairs and thinks she broke her L arm in "several places".

## 2022-11-08 NOTE — Discharge Instructions (Signed)
You were evaluated in the Emergency Department and after careful evaluation, we did not find any emergent condition requiring admission or further testing in the hospital.  Your exam/testing today is overall reassuring.  Your CT and x-rays show a broken bone in your wrist as well as a small broken bone on the right side of your face.  The broken bone in the face is called an orbital wall fracture, it will likely heal well on its own.  For this injury recommend follow-up with the ENT doctor.  Your wrist may need surgery.  Keep the splint on and keep it clean and dry.  Follow-up with Dr. Amedeo Kinsman within the next few days.  Recommend calling the number provided later this morning to schedule an appointment.  Use ibuprofen at home every 4-6 hours for pain, use the Percocet medication for more significant pain.  Please return to the Emergency Department if you experience any worsening of your condition.   Thank you for allowing [place] to be a part of your care.

## 2022-11-08 NOTE — ED Provider Notes (Signed)
[facility] Emergency Department Provider Note MRN:  [id number]  Arrival date & time: 11/08/22     Chief Complaint   Arm Pain   History of Present Illness   Brianna Lopez is a 57 y.o. year-old female with a history of hypertension presenting to the ED with chief complaint of arm pain.  Patient tripped and fell down the stairs.  She has severe pain to her left wrist.  She also has bruising to the right side of her face and a mild headache.  Was reportedly confused and possibly passed out after falling and hitting her head.  Denies neck pain, no back pain, no chest pain, no abdominal pain, no shortness of breath.  Review of Systems  A thorough review of systems was obtained and all systems are negative except as noted in the HPI and PMH.   Patient's Health History    Past Medical History:  Diagnosis Date   Hypertension     History reviewed. No pertinent surgical history.  History reviewed. No pertinent family history.  Social History   Socioeconomic History   Marital status: Single    Spouse name: Not on file   Number of children: Not on file   Years of education: Not on file   Highest education level: Not on file  Occupational History   Not on file  Tobacco Use   Smoking status: Former    Types: Cigarettes   Smokeless tobacco: Never  Substance and Sexual Activity   Alcohol use: Yes   Drug use: Not on file   Sexual activity: Not on file  Other Topics Concern   Not on file  Social History Narrative   Not on file   Social Determinants of Health   Financial Resource Strain: Not on file  Food Insecurity: Not on file  Transportation Needs: Not on file  Physical Activity: Not on file  Stress: Not on file  Social Connections: Not on file  Intimate Partner Violence: Not on file     Physical Exam   Vitals:   11/08/22 0400 11/08/22 0415  BP: 110/73 110/76  Pulse: 89 92  Resp: 14 16  Temp:    SpO2: 96% 96%    CONSTITUTIONAL:  Well-appearing, NAD NEURO/PSYCH:  Alert and oriented x 3, no focal deficits EYES:  eyes equal and reactive ENT/NECK:  no LAD, no JVD CARDIO: Regular rate, well-perfused, normal S1 and S2 PULM:  CTAB no wheezing or rhonchi GI/GU:  non-distended, non-tender MSK/SPINE: Gross deformity to the left wrist, neurovascularly intact SKIN: Bruising and tenderness to the right zygoma   *Additional and/or pertinent findings included in MDM below  Diagnostic and Interventional Summary    EKG Interpretation  Date/Time:  Wednesday November 08 2022 02:24:15 EST Ventricular Rate:  80 PR Interval:  151 QRS Duration: 107 QT Interval:  398 QTC Calculation: 460 R Axis:   17 Text Interpretation: Sinus rhythm RSR' in V1 or V2, right VCD or RVH Baseline wander in lead(s) I II III aVR aVL Confirmed by Gerlene Fee [phone]) on 11/08/2022 2:46:42 AM       Labs Reviewed - No data to display  DG Humerus Left  Final Result    DG Wrist 2 Views Left  Final Result    CT HEAD WO CONTRAST (5MM)  Final Result    CT MAXILLOFACIAL WO CONTRAST  Final Result    DG Forearm Left  Final Result      Medications  HYDROmorphone (DILAUDID) injection 1 mg (  1 mg Intravenous Given 11/08/22 0150)  ketamine 50 mg in normal saline 5 mL (10 mg/mL) syringe (98 mg Intravenous Given 11/08/22 0311)     Procedures  /  Critical Care .Sedation  Date/Time: 11/08/2022 4:26 AM  Performed by: Sabas Sous, MD Authorized by: Sabas Sous, MD   Consent:    Consent obtained:  Verbal and written   Consent given by:  Patient   Risks discussed:  Allergic reaction, dysrhythmia, inadequate sedation, respiratory compromise necessitating ventilatory assistance and intubation, prolonged hypoxia resulting in organ damage, vomiting and nausea   Alternatives discussed: Hematoma block. Universal protocol:    Immediately prior to procedure, a time out was called: yes     Patient identity confirmed:  Arm band Indications:     Procedure performed:  Fracture reduction   Procedure necessitating sedation performed by:  Physician performing sedation Pre-sedation assessment:    Time since last food or drink:  4 hours   ASA classification: class 1 - normal, healthy patient     Mouth opening:  3 or more finger widths   Mallampati score:  I - soft palate, uvula, fauces, pillars visible   Neck mobility: normal     Pre-sedation assessments completed and reviewed: airway patency, cardiovascular function, hydration status, mental status, nausea/vomiting, pain level, respiratory function and temperature   Immediate pre-procedure details:    Reviewed: vital signs, relevant labs/tests and NPO status     Verified: bag valve mask available, emergency equipment available, intubation equipment available, IV patency confirmed, oxygen available and suction available   Procedure details (see MAR for exact dosages):    Preoxygenation:  Nasal cannula   Sedation:  Ketamine   Intended level of sedation: deep   Analgesia:  Hydromorphone   Intra-procedure monitoring:  Blood pressure monitoring, cardiac monitor, continuous pulse oximetry, continuous capnometry, frequent vital sign checks and frequent LOC assessments   Intra-procedure events: none     Total Provider sedation time (minutes):  21 Post-procedure details:    Attendance: Constant attendance by certified staff until patient recovered     Recovery: Patient returned to pre-procedure baseline     Post-sedation assessments completed and reviewed: airway patency, cardiovascular function, hydration status, mental status, nausea/vomiting, pain level, respiratory function and temperature     Patient is stable for discharge or admission: yes     Procedure completion:  Tolerated well, no immediate complications Comments:     1 mg/kg ketamine provided without issue. Reduction of fracture  Date/Time: 11/08/2022 4:29 AM  Performed by: Sabas Sous, MD Authorized by: Sabas Sous, MD   Consent: Verbal consent obtained. Written consent obtained. Risks and benefits: risks, benefits and alternatives were discussed Consent given by: patient Patient understanding: patient states understanding of the procedure being performed Patient consent: the patient's understanding of the procedure matches consent given Procedure consent: procedure consent matches procedure scheduled Relevant documents: relevant documents present and verified Test results: test results available and properly labeled Imaging studies: imaging studies available Patient identity confirmed: verbally with patient Time out: Immediately prior to procedure a "time out" was called to verify the correct patient, procedure, equipment, support staff and site/side marked as required. Local anesthesia used: no  Anesthesia: Local anesthesia used: no  Sedation: Patient sedated: yes  Patient tolerance: patient tolerated the procedure well with no immediate complications Comments: Reduction of left wrist Colles' fracture   .Splint Application  Date/Time: 11/08/2022 4:31 AM  Performed by: Sabas Sous, MD Authorized by: Sabas Sous, MD  Consent:    Consent obtained:  Verbal   Consent given by:  Patient   Risks, benefits, and alternatives were discussed: yes   Universal protocol:    Procedure explained and questions answered to patient or proxy's satisfaction: yes     Immediately prior to procedure a time out was called: yes     Patient identity confirmed:  Verbally with patient Pre-procedure details:    Distal neurologic exam:  Normal   Distal perfusion: distal pulses strong   Procedure details:    Location:  Wrist   Wrist location:  L wrist   Splint type:  Sugar tong   Supplies:  Fiberglass   Attestation: Splint applied and adjusted personally by me   Post-procedure details:    Distal neurologic exam:  Normal   Distal perfusion: distal pulses strong     Procedure completion:  Tolerated well, no  immediate complications   Post-procedure imaging: reviewed     ED Course and Medical Decision Making  Initial Impression and Ddx Mechanical fall, head trauma, loss of consciousness, will need CT imaging to exclude intracranial bleeding, facial fractures.  Gross deformity to the left wrist with bedside x-ray revealing Colles' fracture.  Will need fracture reduction.  Past medical/surgical history that increases complexity of ED encounter: None  Interpretation of Diagnostics I personally reviewed the wrist x-ray and my interpretation is as follows: Displaced Colles' fracture  Radiology noting the complexity of the fracture, its intra-articular nature, etc.  Patient Reassessment and Ultimate Disposition/Management     Patient consented to sedation for the reduction attempt rather than undergoing hematoma block.  Procedures as described above.  She is fully recovered, feels well, neurovascularly intact status post splint placement.  Her CT imaging is negative for intracranial bleeding but does reveal an orbital wall fracture.  She has no evidence of entrapment on exam.  She will need follow-up with ENT as well as orthopedics.  Patient management required discussion with the following services or consulting groups:  None  Complexity of Problems Addressed Acute illness or injury that poses threat of life of bodily function  Additional Data Reviewed and Analyzed Further history obtained from: Further history from spouse/family member  Additional Factors Impacting ED Encounter Risk Prescriptions  Elmer Sow. Pilar Plate, MD [facility] Emergency [facility] [email or web address]  Final Clinical Impressions(s) / ED Diagnoses     ICD-10-CM   1. Closed Colles' fracture of left radius, initial encounter  S52.532A     2. Closed fracture of orbital wall, initial encounter [facility])  S02.85XA       ED Discharge Orders          Ordered    oxyCODONE-acetaminophen  (PERCOCET/ROXICET) 5-325 MG tablet  Every 6 hours PRN        11/08/22 0423             Discharge Instructions Discussed with and Provided to Patient:     Discharge Instructions      You were evaluated in the Emergency Department and after careful evaluation, we did not find any emergent condition requiring admission or further testing in the hospital.  Your exam/testing today is overall reassuring.  Your CT and x-rays show a broken bone in your wrist as well as a small broken bone on the right side of your face.  The broken bone in the face is called an orbital wall fracture, it will likely heal well on its own.  For this injury recommend follow-up with the ENT doctor.  Your wrist may need surgery.  Keep the splint on and keep it clean and dry.  Follow-up with Dr. Amedeo Kinsman within the next few days.  Recommend calling the number provided later this morning to schedule an appointment.  Use ibuprofen at home every 4-6 hours for pain, use the Percocet medication for more significant pain.  Please return to the Emergency Department if you experience any worsening of your condition.   Thank you for allowing [place] to be a part of your care.       Maudie Flakes, MD 11/08/22 [phone]

## 2022-11-09 MED FILL — Oxycodone w/ Acetaminophen Tab 5-325 MG: ORAL | Qty: 6 | Status: AC

## 2022-11-10 ENCOUNTER — Encounter (HOSPITAL_COMMUNITY): Payer: Self-pay | Admitting: Emergency Medicine

## 2022-11-10 ENCOUNTER — Encounter: Payer: Self-pay | Admitting: Orthopedic Surgery

## 2022-11-10 ENCOUNTER — Emergency Department (HOSPITAL_COMMUNITY)
Admission: EM | Admit: 2022-11-10 | Discharge: 2022-11-10 | Disposition: A | Discharge status: Home / Self Care | Payer: BC Managed Care – PPO | Attending: Emergency Medicine | Admitting: Emergency Medicine

## 2022-11-10 ENCOUNTER — Other Ambulatory Visit: Payer: Self-pay

## 2022-11-10 ENCOUNTER — Emergency Department (HOSPITAL_COMMUNITY): Payer: BC Managed Care – PPO

## 2022-11-10 ENCOUNTER — Ambulatory Visit: Payer: BC Managed Care – PPO | Admitting: Orthopedic Surgery

## 2022-11-10 VITALS — Ht 68.0 in | Wt 215.0 lb

## 2022-11-10 DIAGNOSIS — S42201A Unspecified fracture of upper end of right humerus, initial encounter for closed fracture: Secondary | ICD-10-CM | POA: Diagnosis not present

## 2022-11-10 DIAGNOSIS — W1839XA Other fall on same level, initial encounter: Secondary | ICD-10-CM | POA: Insufficient documentation

## 2022-11-10 DIAGNOSIS — S42211A Unspecified displaced fracture of surgical neck of right humerus, initial encounter for closed fracture: Secondary | ICD-10-CM | POA: Insufficient documentation

## 2022-11-10 DIAGNOSIS — S42251A Displaced fracture of greater tuberosity of right humerus, initial encounter for closed fracture: Secondary | ICD-10-CM | POA: Diagnosis not present

## 2022-11-10 DIAGNOSIS — I10 Essential (primary) hypertension: Secondary | ICD-10-CM | POA: Insufficient documentation

## 2022-11-10 DIAGNOSIS — S52532A Colles' fracture of left radius, initial encounter for closed fracture: Secondary | ICD-10-CM

## 2022-11-10 DIAGNOSIS — W19XXXA Unspecified fall, initial encounter: Secondary | ICD-10-CM

## 2022-11-10 DIAGNOSIS — S0511XA Contusion of eyeball and orbital tissues, right eye, initial encounter: Secondary | ICD-10-CM | POA: Insufficient documentation

## 2022-11-10 DIAGNOSIS — S4991XA Unspecified injury of right shoulder and upper arm, initial encounter: Secondary | ICD-10-CM | POA: Diagnosis present

## 2022-11-10 DIAGNOSIS — Z79899 Other long term (current) drug therapy: Secondary | ICD-10-CM | POA: Diagnosis not present

## 2022-11-10 MED ORDER — OXYCODONE-ACETAMINOPHEN 5-325 MG PO TABS: 1.0000 | ORAL_TABLET | Freq: Four times a day (QID) | ORAL | 0 refills | Status: DC | PRN

## 2022-11-10 MED ORDER — HYDROMORPHONE HCL 1 MG/ML IJ SOLN
2.0000 mg | Freq: Once | INTRAMUSCULAR | Status: AC
  Administered 2022-11-10: 2 mg via INTRAMUSCULAR
  Filled 2022-11-10: qty 2

## 2022-11-10 NOTE — ED Provider Notes (Signed)
[facility] Provider Note   CSN: [id number] Arrival date & time: 11/10/22  0054     History  Chief Complaint  Patient presents with   Lytle Michaels    Brianna Lopez is a 57 y.o. female.  Patient is a 57 year old female with past medical history of hypertension, sleep apnea.  Patient presenting today for evaluation of a right shoulder injury.  Patient fell going up stairs 2 nights ago and suffered an orbital fracture and left wrist fracture.  She was in a splint and sling and is to follow-up with orthopedics this morning.  This evening, she was laying on the couch, then stood up to walk across the room.  She states that her leg had fallen asleep causing her to lose balance and again fall on the floor.  This time, she injured her right shoulder.  She is complaining of pain to the right shoulder.  It is worse when she moves.  She was unable to get up off the floor even with her son's assistance, so EMS was called and patient transported here.  The history is provided by the patient.       Home Medications Prior to Admission medications   Medication Sig Start Date End Date Taking? Authorizing Provider  amLODipine (NORVASC) 5 MG tablet Take by mouth.    [provider]  Cholecalciferol 25 MCG (1000 UT) capsule Take by mouth.    [provider]  hydrochlorothiazide (HYDRODIURIL) 25 MG tablet Take by mouth.    [provider]  losartan (COZAAR) 100 MG tablet Take by mouth.    [provider]  oxyCODONE-acetaminophen (PERCOCET/ROXICET) 5-325 MG tablet Take 1 tablet by mouth every 6 (six) hours as needed for severe pain. 11/08/22   Maudie Flakes, MD  rosuvastatin (CRESTOR) 10 MG tablet Take by mouth.    [provider]  venlafaxine XR (EFFEXOR-XR) 150 MG 24 hr capsule Take by mouth.    [provider]  venlafaxine XR (EFFEXOR-XR) 75 MG 24 hr capsule Take 75 mg by mouth daily with breakfast.    [provider]       Allergies    Nitrofurantoin macrocrystal and Other    Review of Systems   Review of Systems  All other systems reviewed and are negative.   Physical Exam Updated Vital Signs BP 128/78   Pulse 83   Temp 98 F (36.7 C) (Oral)   Resp 18   Ht 5\' 8"  (1.727 m)   Wt 97.5 kg   SpO2 98%   BMI 32.69 kg/m  Physical Exam Vitals and nursing note reviewed.  Constitutional:      General: She is not in acute distress.    Appearance: She is well-developed. She is not diaphoretic.  HENT:     Head: Normocephalic.     Comments: There is bruising and ecchymosis of the periorbital tissues on the right. Cardiovascular:     Rate and Rhythm: Normal rate and regular rhythm.     Heart sounds: No murmur heard.    No friction rub. No gallop.  Pulmonary:     Effort: Pulmonary effort is normal. No respiratory distress.     Breath sounds: Normal breath sounds. No wheezing.  Abdominal:     General: Bowel sounds are normal. There is no distension.     Palpations: Abdomen is soft.     Tenderness: There is no abdominal tenderness.  Musculoskeletal:        General: Normal range of  motion.     Cervical back: Normal range of motion and neck supple.     Comments: The right shoulder appears grossly normal.  There is pain with range of motion which is severely limited.  Ulnar and radial pulses are palpable and motor and sensation are intact throughout the entire hand.  The left wrist is splinted and left arm in a sling.  Skin:    General: Skin is warm and dry.  Neurological:     General: No focal deficit present.     Mental Status: She is alert and oriented to person, place, and time.     ED Results / Procedures / Treatments   Labs (all labs ordered are listed, but only abnormal results are displayed) Labs Reviewed - No data to display  EKG None  Radiology DG Humerus Left  Result Date: 11/08/2022 CLINICAL DATA:  Pain Fall EXAM: LEFT HUMERUS - 2+ VIEW COMPARISON:  None Available. FINDINGS:  There is no evidence of fracture or other focal bone lesions. Soft tissues are unremarkable. IMPRESSION: Negative. Electronically Signed   By: Tish Frederickson M.D.   On: 11/08/2022 03:49   DG Wrist 2 Views Left  Result Date: 11/08/2022 CLINICAL DATA:  Insert stray EXAM: LEFT WRIST - 2 VIEW COMPARISON:  X-ray left forearm 11/08/2022 FINDINGS: Redemonstration of an acute, possibly intra-articular, dorsally displaced and angulated, impacted distal radial fracture. Slightly improved anatomical alignment status post cast placement. No dislocation. There is no evidence of arthropathy or other focal bone abnormality. Soft tissues are unremarkable. IMPRESSION: Slightly improved anatomical alignment of an acute, possibly intra-articular, dorsally displaced and angulated, impacted distal radial fracture. Electronically Signed   By: Tish Frederickson M.D.   On: 11/08/2022 03:47   CT HEAD WO CONTRAST ( )  Result Date: 11/08/2022 CLINICAL DATA:  Head trauma, moderate-severe; Facial trauma, blunt EXAM: CT HEAD WITHOUT CONTRAST CT MAXILLOFACIAL WITHOUT CONTRAST TECHNIQUE: Multidetector CT imaging of the head and maxillofacial structures were performed using the standard protocol without intravenous contrast. Multiplanar CT image reconstructions of the maxillofacial structures were also generated. RADIATION DOSE REDUCTION: This exam was performed according to the departmental dose-optimization program which includes automated exposure control, adjustment of the mA and/or kV according to patient size and/or use of iterative reconstruction technique. COMPARISON:  None Available. FINDINGS: CT HEAD FINDINGS Brain: No evidence of large-territorial acute infarction. No parenchymal hemorrhage. No mass lesion. No extra-axial collection. No mass effect or midline shift. No hydrocephalus. Basilar cisterns are patent. Vascular: No hyperdense vessel. Skull: No acute fracture or focal lesion. Other: None. CT MAXILLOFACIAL FINDINGS  Osseous: Acute 4 mm depressed right orbital floor fracture. Age-indeterminate nondisplaced left lamina papyracea fracture. No destructive process. Sinuses/Orbits: High density fluid within the right maxillary sinus air-fluid level consistent with blood products. Mucosal thickening of left ethmoid sinuses. Mild mucosal thickening of left maxillary sinus. Otherwise paranasal sinuses and mastoid air cells are clear. The orbits are unremarkable. Soft tissues: Sub cutaneous soft tissue edema and hematoma of the right maxillary soft tissues. Other: Visualized upper cervical spine demonstrates no traumatic injury. IMPRESSION: 1. No acute intracranial abnormality. 2. Acute 4 mm depressed right orbital floor fracture. Correlate with physical exam for ocular muscle entrapment. 3.  Age-indeterminate nondisplaced left lamina papyracea fracture. Electronically Signed   By: Tish Frederickson M.D.   On: 11/08/2022 02:54   CT MAXILLOFACIAL WO CONTRAST  Result Date: 11/08/2022 CLINICAL DATA:  Head trauma, moderate-severe; Facial trauma, blunt EXAM: CT HEAD WITHOUT CONTRAST CT MAXILLOFACIAL WITHOUT CONTRAST TECHNIQUE: Multidetector CT  imaging of the head and maxillofacial structures were performed using the standard protocol without intravenous contrast. Multiplanar CT image reconstructions of the maxillofacial structures were also generated. RADIATION DOSE REDUCTION: This exam was performed according to the departmental dose-optimization program which includes automated exposure control, adjustment of the mA and/or kV according to patient size and/or use of iterative reconstruction technique. COMPARISON:  None Available. FINDINGS: CT HEAD FINDINGS Brain: No evidence of large-territorial acute infarction. No parenchymal hemorrhage. No mass lesion. No extra-axial collection. No mass effect or midline shift. No hydrocephalus. Basilar cisterns are patent. Vascular: No hyperdense vessel. Skull: No acute fracture or focal lesion. Other:  None. CT MAXILLOFACIAL FINDINGS Osseous: Acute 4 mm depressed right orbital floor fracture. Age-indeterminate nondisplaced left lamina papyracea fracture. No destructive process. Sinuses/Orbits: High density fluid within the right maxillary sinus air-fluid level consistent with blood products. Mucosal thickening of left ethmoid sinuses. Mild mucosal thickening of left maxillary sinus. Otherwise paranasal sinuses and mastoid air cells are clear. The orbits are unremarkable. Soft tissues: Sub cutaneous soft tissue edema and hematoma of the right maxillary soft tissues. Other: Visualized upper cervical spine demonstrates no traumatic injury. IMPRESSION: 1. No acute intracranial abnormality. 2. Acute 4 mm depressed right orbital floor fracture. Correlate with physical exam for ocular muscle entrapment. 3.  Age-indeterminate nondisplaced left lamina papyracea fracture. Electronically Signed   By: Iven Finn M.D.   On: 11/08/2022 02:54   DG Forearm Left  Result Date: 11/08/2022 CLINICAL DATA:  Fall with pain below shoulder to wrist EXAM: LEFT FOREARM - 2 VIEW COMPARISON:  None Available. FINDINGS: Acute, possibly intra-articular, dorsally displaced and angulated, impacted distal radial fracture. There is no evidence of fracture or other focal bone lesions. Soft tissues are unremarkable. IMPRESSION: Acute, possibly intra-articular, dorsally displaced and angulated, impacted distal radial fracture. Electronically Signed   By: Iven Finn M.D.   On: 11/08/2022 01:59    Procedures Procedures    Medications Ordered in ED Medications  HYDROmorphone (DILAUDID) injection 2 mg (has no administration in time range)    ED Course/ Medical Decision Making/ A&P  Patient is a 57 year old female presenting after a fall.  She was attempting to get up this evening when her leg fell asleep causing her to fall and injure her right shoulder.  Her x-rays show a comminuted fracture of the surgical neck of the humerus.   This will be placed in a splint and patient is to follow-up with orthopedics.  She had another fall 2 days ago while climbing the stairs and injured her left wrist.  She has fractures of the distal radius and is in a splint.  Patient now has both arms immobilized, but did seem able to ambulate in the ER without difficulty.  She has an appointment this afternoon with the orthopedic surgeon and I feel as though she can safely be discharged.  Her son is here and is taking care of her and is also comfortable with her returning home.  I will refill her Percocet prescription.  Final Clinical Impression(s) / ED Diagnoses Final diagnoses:  None    Rx / DC Orders ED Discharge Orders     None         Veryl Speak, MD 11/10/22 [phone]

## 2022-11-10 NOTE — ED Notes (Addendum)
Pt ambulatory with standby assist- no problems noted, slow steady gait, pt son at bedside, reports he lives with pt and was there when pt fell

## 2022-11-10 NOTE — ED Triage Notes (Signed)
Pt had mechanical fall tonight at home due to her foot going to sleep. Pt fell and landed on right shoulder. Pt reports limited movement to right arm now.   Pt recently seen 2 days ago for left wrist fracture. Has ortho appt in the morning at 11.

## 2022-11-10 NOTE — Discharge Instructions (Addendum)
Begin taking Percocet as prescribed as needed for pain.  Ice your shoulder for 20 minutes every 2 hours while awake for the next 2 days.  Follow-up with orthopedics today as previously scheduled.

## 2022-11-11 ENCOUNTER — Encounter: Payer: Self-pay | Admitting: Orthopedic Surgery

## 2022-11-11 NOTE — Progress Notes (Signed)
New Patient Visit  Assessment: Brianna Lopez is a 57 y.o. female with the following: 1. Colles' fracture of left radius, initial encounter for closed fracture 2. Closed fracture of proximal end of right humerus, unspecified fracture morphology, Initial encounter  Plan: Brianna Lopez sustained a displaced left distal radius fracture.  She underwent an excellent reduction in the ED.  However, due to the dorsal comminution, anticipate that the reduction will further displace.  As such I have recommended ORIF.  Prior to presentation for her left wrist injury, she sustained another fall and has a minimally displaced fracture of the right proximal humerus.  I believe this injury can be monitored closely, with continued nonoperative management.   After discussing all of this, it was determined that her insurance is out of network with our practice.  As such, she is requesting urgent referral to another provider.  We will place a referral and provide necessary care until she is evaluated elsewhere.   Follow-up: Return if symptoms worsen or fail to improve, for Referral to hand specialist.  Subjective:  Chief Complaint  Patient presents with   Fracture    L radius DOI 11/08/22    History of Present Illness: Brianna Lopez is a 57 y.o. female who presents for evaluation of left wrist pain and right shoulder pain.  A few days ago, she lost her balance and landed on an outstretched left hand.  She presented to the ED and XR showed a distal radius fracture.  She underwent a reduction and was placed in a splint.  Prior to presenting to clinic for treatment for this injury, she fell again and sustained a right proximal humerus fracture.  She also hit her head.  Pain is controlled.  She is currently wearing a sling on both arms.     Review of Systems: No fevers or chills No numbness or tingling No chest pain No shortness of breath No bowel or bladder dysfunction No GI distress No  headaches   Medical History:  Past Medical History:  Diagnosis Date   Hypertension     History reviewed. No pertinent surgical history.  History reviewed. No pertinent family history. Social History   Tobacco Use   Smoking status: Former    Types: Cigarettes   Smokeless tobacco: Never  Substance Use Topics   Alcohol use: Yes    Allergies  Allergen Reactions   Nitrofurantoin Macrocrystal     Other reaction(s): Other (See Comments)   Other     Other reaction(s): Unknown    Current Meds  Medication Sig   amLODipine (NORVASC) 5 MG tablet Take by mouth.   Cholecalciferol 25 MCG (1000 UT) capsule Take by mouth.   hydrochlorothiazide (HYDRODIURIL) 25 MG tablet Take by mouth.   losartan (COZAAR) 100 MG tablet Take by mouth.   oxyCODONE-acetaminophen (PERCOCET) 5-325 MG tablet Take 1-2 tablets by mouth every 6 (six) hours as needed.   rosuvastatin (CRESTOR) 10 MG tablet Take by mouth.   venlafaxine XR (EFFEXOR-XR) 150 MG 24 hr capsule Take by mouth.   venlafaxine XR (EFFEXOR-XR) 75 MG 24 hr capsule Take 75 mg by mouth daily with breakfast.    Objective: Ht 5\' 8"  (1.727 m)   Wt 215 lb (97.5 kg)   BMI 32.69 kg/m   Physical Exam:  General: Alert and oriented. and No acute distress. Gait: Normal gait.  Facial trauma with hematoma over right orbit.  Left arm in sling and well fitting splint.  Fingers are swollen.  No skin breakdown.  Able to wiggle exposed fingers.  Sensaiton intact to exposed fingers.   Right arm in a sling.  Diffuse swelling in arm.  Sensation intact throughout the right arm.  Fingers are warm and well perfused.   IMAGING: I personally reviewed images previously obtained from the ED  Left wrist with displaced distal radius fracture.  Possible intra-articular involvement.  Dorsal comminution.  Neutral joint line on the lateral.  XR right shoulder with Comminuted displaced fracture of the surgical neck and involving the greater tuberosity.   New  Medications:  No orders of the defined types were placed in this encounter.     Mordecai Rasmussen, MD  11/11/2022 11:58 PM

## 2022-11-12 ENCOUNTER — Encounter: Payer: Self-pay | Admitting: Orthopedic Surgery

## 2023-02-12 ENCOUNTER — Ambulatory Visit (INDEPENDENT_AMBULATORY_CARE_PROVIDER_SITE_OTHER): Payer: BC Managed Care – PPO | Admitting: Internal Medicine

## 2023-02-12 VITALS — BP 108/76 | HR 86 | Resp 14 | Ht 68.0 in | Wt 208.0 lb

## 2023-02-12 DIAGNOSIS — G4733 Obstructive sleep apnea (adult) (pediatric): Secondary | ICD-10-CM | POA: Diagnosis not present

## 2023-02-12 DIAGNOSIS — I10 Essential (primary) hypertension: Secondary | ICD-10-CM | POA: Diagnosis not present

## 2023-02-12 DIAGNOSIS — Z7189 Other specified counseling: Secondary | ICD-10-CM | POA: Diagnosis not present

## 2023-02-12 NOTE — Patient Instructions (Signed)

## 2023-02-12 NOTE — Progress Notes (Signed)
[facility] [address] [place], [place] [address]  Pulmonary Sleep Medicine   Office Visit Note  Patient Name: Brianna Lopez DOB: 1966/05/17 MRN [id number]    Chief Complaint: Obstructive Sleep Apnea visit  Brief History:  Brianna Lopez is seen today for follow up 3 months after setup on APAP at 5-20 cmh20.  The patient has a 7 month history of sleep apnea. Patient is not using PAP nightly.  The patient feels rested after sleeping with PAP.  The patient reports benefiting from PAP use. Reported sleepiness is  improved and the Epworth Sleepiness Score is 1 out of 24. The patient does not take naps. The patient complains of the following: No complaints.  The compliance download shows 32% compliance with an average use time of 2:45 hours. The AHI is 4.7.  The patient does not complain of limb movements disrupting sleep.  ROS  General: (-) fever, (-) chills, (-) night sweat Nose and Sinuses: (-) nasal stuffiness or itchiness, (-) postnasal drip, (-) nosebleeds, (-) sinus trouble. Mouth and Throat: (-) sore throat, (-) hoarseness. Neck: (-) swollen glands, (-) enlarged thyroid, (-) neck pain. Respiratory: - cough, - shortness of breath, - wheezing. Neurologic: - numbness, - tingling. Psychiatric: + anxiety, + depression   Current Medication: Outpatient Encounter Medications as of 02/12/2023  Medication Sig   amLODipine (NORVASC) 5 MG tablet Take 1 tablet by mouth daily.   Cholecalciferol 25 MCG (1000 UT) capsule Take by mouth.   hydrochlorothiazide (HYDRODIURIL) 25 MG tablet Take by mouth.   losartan (COZAAR) 100 MG tablet Take by mouth.   rosuvastatin (CRESTOR) 10 MG tablet Take by mouth.   traMADol (ULTRAM) 50 MG tablet TAKE ONE TABLET BY MOUTH EVERY SIX HOURS   venlafaxine XR (EFFEXOR-XR) 150 MG 24 hr capsule Take by mouth.   venlafaxine XR (EFFEXOR-XR) 75 MG 24 hr capsule Take 75 mg by mouth daily with breakfast.   [DISCONTINUED] amLODipine (NORVASC) 5 MG tablet Take  by mouth.   [DISCONTINUED] oxyCODONE-acetaminophen (PERCOCET) 5-325 MG tablet Take 1-2 tablets by mouth every 6 (six) hours as needed.   No facility-administered encounter medications on file as of 02/12/2023.    Surgical History: No past surgical history on file.  Medical History: Past Medical History:  Diagnosis Date   Hypertension     Family History: Non contributory to the present illness  Social History: Social History   Socioeconomic History   Marital status: Single    Spouse name: Not on file   Number of children: Not on file   Years of education: Not on file   Highest education level: Not on file  Occupational History   Not on file  Tobacco Use   Smoking status: Former    Types: Cigarettes   Smokeless tobacco: Never  Substance and Sexual Activity   Alcohol use: Yes   Drug use: Not on file   Sexual activity: Not on file  Other Topics Concern   Not on file  Social History Narrative   Not on file   Social Determinants of Health   Financial Resource Strain: Not on file  Food Insecurity: Not on file  Transportation Needs: Not on file  Physical Activity: Not on file  Stress: Not on file  Social Connections: Not on file  Intimate Partner Violence: Not on file    Vital Signs: Blood pressure 108/76, pulse 86, resp. rate 14, height 5\' 8"  (1.727 m), weight 208 lb (94.3 kg), SpO2 98 %. Body mass index is 31.63  kg/m.    Examination: General Appearance: The patient is well-developed, well-nourished, and in no distress. Neck Circumference: 42 cm Skin: Gross inspection of skin unremarkable. Head: normocephalic, no gross deformities. Eyes: no gross deformities noted. ENT: ears appear grossly normal Neurologic: Alert and oriented. No involuntary movements.  STOP BANG RISK ASSESSMENT S (snore) Have you been told that you snore?     NO   T (tired) Are you often tired, fatigued, or sleepy during the day?   NO  O (obstruction) Do you stop breathing, choke, or  gasp during sleep? NO   P (pressure) Do you have or are you being treated for high blood pressure? NO   B (BMI) Is your body index greater than 35 kg/m? NO   A (age) Are you 59 years old or older? YES   N (neck) Do you have a neck circumference greater than 16 inches?   YES   G (gender) Are you a female? NO   TOTAL STOP/BANG "YES" ANSWERS 2       A STOP-Bang score of 2 or less is considered low risk, and a score of 5 or more is high risk for having either moderate or severe OSA. For people who score 3 or 4, doctors may need to perform further assessment to determine how likely they are to have OSA.         EPWORTH SLEEPINESS SCALE:  Scale:  (0)= no chance of dozing; (1)= slight chance of dozing; (2)= moderate chance of dozing; (3)= high chance of dozing  Chance  Situtation    Sitting and reading: 0    Watching TV: 0    Sitting Inactive in public: 0    As a passenger in car: 0      Lying down to rest: 1    Sitting and talking: 0    Sitting quielty after lunch: 0    In a car, stopped in traffic: 0   TOTAL SCORE:   1 out of 24    SLEEP STUDIES:  HST (07/11/22)  REI 17, min SPO2 70%   CPAP COMPLIANCE DATA:  Date Range: 10/25/22 - 02/07/23  Average Daily Use: 2:45 hours  Median Use: 8:29 hours  Compliance for > 4 Hours: 34 days  AHI: 4.7 respiratory events per hour  Days Used: 35/106  Mask Leak: 31.2  95th Percentile Pressure: 12.6 cmh20         LABS: No results found for this or any previous visit (from the past 2160 hour(s)).  Radiology: DG Shoulder Right Portable  Result Date: 11/10/2022 CLINICAL DATA:  Fall with right shoulder pain. Limited movement to right arm. EXAM: RIGHT SHOULDER - 1 VIEW COMPARISON:  None Available. FINDINGS: There is a comminuted fracture of the surgical neck with involvement of the greater tuberosity with mild medial displacement of the distal fracture fragment. No dislocation. Mild degenerative changes at the  acromioclavicular joint. The soft tissues are within normal limits. IMPRESSION: Comminuted displaced fracture of the surgical neck and involving the greater tuberosity. Electronically Signed   By: Thornell Sartorius M.D.   On: 11/10/2022 01:21    No results found.  No results found.    Assessment and Plan: Patient Active Problem List   Diagnosis Date Noted   OSA (obstructive sleep apnea) 10/09/2022   Obesity (BMI 30.0-34.9) 10/09/2022   Essential hypertension 06/28/2022   Anxiety and depression 06/28/2022   1. OSA on CPAP The patient does tolerate PAP and reports  benefit from PAP use.  The patient was reminded how to clean equipment and advised to replace supplies routinely. The patient was also counselled on weight loss. The compliance is improving. The AHI is 4.7, borderline, but likely due to mask leak.   OSA on cpap- borderline control. Increase use, work on mask leak. F/u 31m   2. CPAP use counseling CPAP Counseling: had a lengthy discussion with the patient regarding the importance of PAP therapy in management of the sleep apnea. Patient appears to understand the risk factor reduction and also understands the risks associated with untreated sleep apnea. Patient will try to make a good faith effort to remain compliant with therapy. Also instructed the patient on proper cleaning of the device including the water must be changed daily if possible and use of distilled water is preferred. Patient understands that the machine should be regularly cleaned with appropriate recommended cleaning solutions that do not damage the PAP machine for example given white vinegar and water rinses. Other methods such as ozone treatment may not be as good as these simple methods to achieve cleaning.   3. Essential hypertension Hypertension Counseling:   The following hypertensive lifestyle modification were recommended and discussed:  1. Limiting alcohol intake to less than 1 oz/day of ethanol:(24 oz of  beer or 8 oz of wine or 2 oz of 100-proof whiskey). 2. Take baby ASA 81 mg daily. 3. Importance of regular aerobic exercise and losing weight. 4. Reduce dietary saturated fat and cholesterol intake for overall cardiovascular health. 5. Maintaining adequate dietary potassium, calcium, and magnesium intake. 6. Regular monitoring of the blood pressure. 7. Reduce sodium intake to less than 100 mmol/day (less than 2.3 gm of sodium or less than 6 gm of sodium choride)        General Counseling: I have discussed the findings of the evaluation and examination with Caleb.  I have also discussed any further diagnostic evaluation thatmay be needed or ordered today. Aliany verbalizes understanding of the findings of todays visit. We also reviewed her medications today and discussed drug interactions and side effects including but not limited excessive drowsiness and altered mental states. We also discussed that there is always a risk not just to her but also people around her. she has been encouraged to call the office with any questions or concerns that should arise related to todays visit.  No orders of the defined types were placed in this encounter.       I have personally obtained a history, examined the patient, evaluated laboratory and imaging results, formulated the assessment and plan and placed orders. This patient was seen today by Emmaline Kluver, PA-C in collaboration with Dr. Freda Munro.   Yevonne Pax, MD [facility] Diplomate ABMS Pulmonary Critical Care Medicine and Sleep Medicine

## 2023-06-11 ENCOUNTER — Ambulatory Visit (INDEPENDENT_AMBULATORY_CARE_PROVIDER_SITE_OTHER): Payer: BC Managed Care – PPO | Admitting: Internal Medicine

## 2023-06-11 VITALS — BP 131/72 | HR 79 | Resp 14 | Ht 68.0 in | Wt 208.0 lb

## 2023-06-11 DIAGNOSIS — Z7189 Other specified counseling: Secondary | ICD-10-CM

## 2023-06-11 DIAGNOSIS — I10 Essential (primary) hypertension: Secondary | ICD-10-CM | POA: Diagnosis not present

## 2023-06-11 DIAGNOSIS — G4733 Obstructive sleep apnea (adult) (pediatric): Secondary | ICD-10-CM | POA: Diagnosis not present

## 2023-06-11 DIAGNOSIS — F419 Anxiety disorder, unspecified: Secondary | ICD-10-CM | POA: Insufficient documentation

## 2023-06-11 NOTE — Patient Instructions (Signed)

## 2023-06-11 NOTE — Progress Notes (Signed)
[facility] [address] [place], [place] [address]  Pulmonary Sleep Medicine   Office Visit Note  Patient Name: ANGELIGUE RAPPLEYEA DOB: [birth year] MRN [id number]    Chief Complaint: Obstructive Sleep Apnea visit  Brief History:  Telah is seen today for a 3 month follow up on APAP at 5-20 cmh20.  The patient has a 1 year history of sleep apnea. Patient is using PAP nightly.  The patient feels rested after sleeping with PAP.  The patient reports benefiting from PAP use. Reported sleepiness is  improved and the Epworth Sleepiness Score is 0 out of 24. The patient does not take naps. The patient complains of the following: No complaints.  The compliance download shows 76% compliance with an average use time of 6:23 hours. The AHI is 4.0.  The patient does not complain of limb movements disrupting sleep.  ROS  General: (-) fever, (-) chills, (-) night sweat Nose and Sinuses: (-) nasal stuffiness or itchiness, (-) postnasal drip, (-) nosebleeds, (-) sinus trouble. Mouth and Throat: (-) sore throat, (-) hoarseness. Neck: (-) swollen glands, (-) enlarged thyroid, (-) neck pain. Respiratory: - cough, - shortness of breath, - wheezing. Neurologic: - numbness, - tingling. Psychiatric: - anxiety, - depression   Current Medication: Outpatient Encounter Medications as of 06/11/2023  Medication Sig   bumetanide (BUMEX) 0.5 MG tablet 1/2-1 tablet Orally Once daily PRN for edema for 30 days   amLODipine (NORVASC) 5 MG tablet Take 1 tablet by mouth daily.   Cholecalciferol 25 MCG (1000 UT) capsule Take by mouth.   hydrochlorothiazide (HYDRODIURIL) 25 MG tablet Take by mouth.   losartan (COZAAR) 100 MG tablet Take by mouth.   phentermine 30 MG capsule Take 30 mg by mouth every morning.   rosuvastatin (CRESTOR) 10 MG tablet Take by mouth.   venlafaxine XR (EFFEXOR-XR) 150 MG 24 hr capsule Take by mouth.   venlafaxine XR (EFFEXOR-XR) 75 MG 24 hr capsule Take 75 mg by mouth daily with  breakfast.   Vitamin D, Ergocalciferol, (DRISDOL) 1.25 MG (50000 UNIT) CAPS capsule TAKE 1 CAPSULE BY MOUTH ONCE A WEEK for 29   [DISCONTINUED] traMADol (ULTRAM) 50 MG tablet TAKE ONE TABLET BY MOUTH EVERY SIX HOURS   No facility-administered encounter medications on file as of 06/11/2023.    Surgical History: No past surgical history on file.  Medical History: Past Medical History:  Diagnosis Date   Hypertension     Family History: Non contributory to the present illness  Social History: Social History   Socioeconomic History   Marital status: Single    Spouse name: Not on file   Number of children: Not on file   Years of education: Not on file   Highest education level: Not on file  Occupational History   Not on file  Tobacco Use   Smoking status: Former    Types: Cigarettes   Smokeless tobacco: Never  Substance and Sexual Activity   Alcohol use: Yes   Drug use: Not on file   Sexual activity: Not on file  Other Topics Concern   Not on file  Social History Narrative   Not on file   Social Determinants of Health   Financial Resource Strain: Not on file  Food Insecurity: Not on file  Transportation Needs: Not on file  Physical Activity: Not on file  Stress: Not on file  Social Connections: Unknown (01/31/2023)   Received from [facility], [facility]   Social Network    Social Network:  Not on file  Intimate Partner Violence: Unknown (01/31/2023)   Received from [facility]   HITS    Physically Hurt: Not on file    Insult or Talk Down To: Not on file    Threaten Physical Harm: Not on file    Scream or Curse: Not on file    Vital Signs: Blood pressure 131/72, pulse 79, resp. rate 14, height 5\' 8"  (1.727 m), weight 208 lb (94.3 kg), SpO2 99%. Body mass index is 31.63 kg/m.    Examination: General Appearance: The patient is well-developed, well-nourished, and in no distress. Neck Circumference: 42 cm Skin: Gross inspection of skin  unremarkable. Head: normocephalic, no gross deformities. Eyes: no gross deformities noted. ENT: ears appear grossly normal Neurologic: Alert and oriented. No involuntary movements.  STOP BANG RISK ASSESSMENT S (snore) Have you been told that you snore?     NO   T (tired) Are you often tired, fatigued, or sleepy during the day?   NO  O (obstruction) Do you stop breathing, choke, or gasp during sleep? NO   P (pressure) Do you have or are you being treated for high blood pressure? NO   B (BMI) Is your body index greater than 35 kg/m? NO   A (age) Are you 59 years old or older? YES   N (neck) Do you have a neck circumference greater than 16 inches?   YES   G (gender) Are you a female? NO   TOTAL STOP/BANG "YES" ANSWERS 2       A STOP-Bang score of 2 or less is considered low risk, and a score of 5 or more is high risk for having either moderate or severe OSA. For people who score 3 or 4, doctors may need to perform further assessment to determine how likely they are to have OSA.         EPWORTH SLEEPINESS SCALE:  Scale:  (0)= no chance of dozing; (1)= slight chance of dozing; (2)= moderate chance of dozing; (3)= high chance of dozing  Chance  Situtation    Sitting and reading: 0    Watching TV: 0    Sitting Inactive in public: 0    As a passenger in car: 0      Lying down to rest: 0    Sitting and talking: 0    Sitting quielty after lunch: 0    In a car, stopped in traffic: 0   TOTAL SCORE:   0 out of 24    SLEEP STUDIES:  HST (07/11/22) REI 17, SPO2 70%   CPAP COMPLIANCE DATA:  Date Range: 02/07/23 - 06/06/23  Average Daily Use: 6:23 hours  Median Use: 7:23 hours  Compliance for > 4 Hours: 91 days  AHI: 4.0 respiratory events per hour  Days Used: 111/120  Mask Leak: 37.3  95th Percentile Pressure: 12.6 cmh20         LABS: No results found for this or any previous visit (from the past 2160 hour(s)).  Radiology: DG Shoulder Right  Portable  Result Date: 11/10/2022 CLINICAL DATA:  Fall with right shoulder pain. Limited movement to right arm. EXAM: RIGHT SHOULDER - 1 VIEW COMPARISON:  None Available. FINDINGS: There is a comminuted fracture of the surgical neck with involvement of the greater tuberosity with mild medial displacement of the distal fracture fragment. No dislocation. Mild degenerative changes at the acromioclavicular joint. The soft tissues are within normal limits. IMPRESSION: Comminuted displaced fracture of the surgical neck and  involving the greater tuberosity. Electronically Signed   By: Thornell Sartorius M.D.   On: 11/10/2022 01:21    No results found.  No results found.    Assessment and Plan: Patient Active Problem List   Diagnosis Date Noted   Anxiety 06/11/2023   OSA (obstructive sleep apnea) 10/09/2022   Obesity (BMI 30.0-34.9) 10/09/2022   Essential hypertension 06/28/2022   Anxiety and depression 06/28/2022   1. OSA on CPAP The patient does tolerate PAP and reports  benefit from PAP use. The patient was reminded how to clean equipment and advised to replace supplies routinely. The patient was also counselled on mask leak. . The compliance is fair. The AHI is 4.0, due to the mask leak.   OSA on cpap- controlled. Continue with fair compliance with pap. CPAP continues to be medically necessary to treat this patient's OSA. F/u one year.    2. CPAP use counseling CPAP Counseling: had a lengthy discussion with the patient regarding the importance of PAP therapy in management of the sleep apnea. Patient appears to understand the risk factor reduction and also understands the risks associated with untreated sleep apnea. Patient will try to make a good faith effort to remain compliant with therapy. Also instructed the patient on proper cleaning of the device including the water must be changed daily if possible and use of distilled water is preferred. Patient understands that the machine should be  regularly cleaned with appropriate recommended cleaning solutions that do not damage the PAP machine for example given white vinegar and water rinses. Other methods such as ozone treatment may not be as good as these simple methods to achieve cleaning.   3. Essential hypertension Hypertension Counseling:   The following hypertensive lifestyle modification were recommended and discussed:  1. Limiting alcohol intake to less than 1 oz/day of ethanol:(24 oz of beer or 8 oz of wine or 2 oz of 100-proof whiskey). 2. Take baby ASA 81 mg daily. 3. Importance of regular aerobic exercise and losing weight. 4. Reduce dietary saturated fat and cholesterol intake for overall cardiovascular health. 5. Maintaining adequate dietary potassium, calcium, and magnesium intake. 6. Regular monitoring of the blood pressure. 7. Reduce sodium intake to less than 100 mmol/day (less than 2.3 gm of sodium or less than 6 gm of sodium choride)       General Counseling: I have discussed the findings of the evaluation and examination with Latrina.  I have also discussed any further diagnostic evaluation thatmay be needed or ordered today. Lani verbalizes understanding of the findings of todays visit. We also reviewed her medications today and discussed drug interactions and side effects including but not limited excessive drowsiness and altered mental states. We also discussed that there is always a risk not just to her but also people around her. she has been encouraged to call the office with any questions or concerns that should arise related to todays visit.  No orders of the defined types were placed in this encounter.       I have personally obtained a history, examined the patient, evaluated laboratory and imaging results, formulated the assessment and plan and placed orders. This patient was seen today by Emmaline Kluver, PA-C in collaboration with Dr. Freda Munro.   Yevonne Pax, MD [facility] Diplomate ABMS Pulmonary  Critical Care Medicine and Sleep Medicine

## 2023-11-23 ENCOUNTER — Ambulatory Visit: Payer: 59 | Admitting: Obstetrics & Gynecology

## 2023-11-23 ENCOUNTER — Other Ambulatory Visit (HOSPITAL_COMMUNITY)
Admission: RE | Admit: 2023-11-23 | Discharge: 2023-11-23 | Disposition: A | Discharge status: Home / Self Care | Payer: 59 | Source: Ambulatory Visit | Attending: Obstetrics & Gynecology | Admitting: Obstetrics & Gynecology

## 2023-11-23 ENCOUNTER — Encounter: Payer: Self-pay | Admitting: Obstetrics & Gynecology

## 2023-11-23 VITALS — BP 116/72 | HR 77 | Ht 68.0 in | Wt 221.0 lb

## 2023-11-23 DIAGNOSIS — N841 Polyp of cervix uteri: Secondary | ICD-10-CM | POA: Diagnosis present

## 2023-11-23 DIAGNOSIS — N950 Postmenopausal bleeding: Secondary | ICD-10-CM | POA: Diagnosis not present

## 2023-11-23 NOTE — Progress Notes (Signed)
   GYN VISIT Patient name: Brianna Lopez MRN [id number]  Date of birth: Dec 10, 1965 Chief Complaint:   Cervical Polyp  History of Present Illness:   Brianna Lopez is a 58 y.o. G1P1 PM female being seen in consultation for the following concerns:  Cervical polyp: Patient recently completed her annual exam with Pap and at that visit a cervical polyp was seen.  Of note, she did have an episode of spotting.  In Nov, started noted red spotting- typically about 1-2x per day- lasted for a week and then resolved.  Denies bleeding currently.  Denies vaginal discharge, itching or irritation.  Denies pelvic or abdominal pain.  Not currently sexually active but may consider in the future denies vaginal dryness.  She is concerned about narrowed vaginal canal.  No LMP recorded. Patient is postmenopausal.    Review of Systems:   Pertinent items are noted in HPI Denies fever/chills, dizziness, headaches, visual disturbances, fatigue, shortness of breath, chest pain, abdominal pain, vomiting, no problems with bowel movements, urination, or intercourse unless otherwise stated above.  Pertinent History Reviewed:   Past Surgical History:  Procedure Laterality Date   ABDOMINAL HERNIA REPAIR     APPENDECTOMY     CESAREAN SECTION     left wrist surgery     right shoulder surgery      Past Medical History:  Diagnosis Date   Asthma    Elevated liver enzymes    Fatty liver    High cholesterol    Hypertension    Vitamin D deficiency    Reviewed problem list, medications and allergies. Physical Assessment:   Vitals:   11/23/23 1030  BP: 116/72  Pulse: 77  Weight: 221 lb (100.2 kg)  Height: 5\' 8"  (1.727 m)  Body mass index is 33.6 kg/m.       Physical Examination:   General appearance: alert, well appearing, and in no distress  Psych: mood appropriate, normal affect  Skin: warm & dry   Cardiovascular: normal heart rate noted  Respiratory: normal respiratory effort, no distress  Abdomen:  soft, non-tender   Pelvic: VULVA: normal appearing vulva with no masses, tenderness or lesions, VAGINA: normal appearing vagina with normal color and discharge, no lesions, CERVIX: normal appearing cervix without discharge or lesions  Extremities: no edema   Chaperone: Faith Rogue    Polypoid lesion noted in the ectocervix with narrow stalk.  CERVICAL POLYPECTOMY NOTE Risks of the biopsy including pain, bleeding, infection, inadequate specimen, and need for additional procedures were discussed. The patient stated understanding and agreed to undergo procedure today. Consent was signed,  time out performed.  Chaperone was present during entire procedure. The patient's cervix was prepped with Betadine.  Due to the small nature and location of the polyp- tissler forceps were used to grasp and remove the polyp. Tissue sent to pathology for analysis. Silver nitrate stick was used for hemostasis  The patient tolerated the procedure well.  Assessment & Plan:  1) Cervical polyp -Removed without difficulty, pathology pending -Reviewed postprocedure expectations  2) postmenopausal bleeding -Suspect bleeding may be due to polyp however will plan for pelvic ultrasound to rule out underlying etiology and check endometrial thickness  -reviewed postmenopausal concerns  Orders Placed This Encounter  Procedures   US PELVIC COMPLETE WITH TRANSVAGINAL    Return for please schedule pelvic US next available.   Myna Hidalgo, DO Attending Obstetrician & Gynecologist, [facility] for [employer], [facility]

## 2023-11-26 LAB — SURGICAL PATHOLOGY

## 2023-12-04 ENCOUNTER — Ambulatory Visit: Payer: 59 | Admitting: Radiology

## 2023-12-04 DIAGNOSIS — N950 Postmenopausal bleeding: Secondary | ICD-10-CM

## 2023-12-04 NOTE — Progress Notes (Signed)
[place]:  TA and TV imaging performed  -  Chaperone: Whitney  -  vinyl probe cover used  Mid plane uterus normal in size - no evidence of fibroids, myometrium appears symmetrical and homogeneous.  There appears to be an elongated soft tissue mass within upper to mid cavity with few vascular focal areas seen.  Both venous and arterial flow seen within this mass measuring approximately 31 x 7 x 17 mm.  (Likely polyp)  There is an echofree simple cyst within this mass = 7 mm Peristalsis within cavity is seen with soft tissue mass slowly moving.  Em thickness ~ 8.4 mm Because the uterus is mid plane, the fundus is in the far field and resolution is a bit compromised.  The ovaries are only seen Trans-abdominally and appear normal Neg adnexal regions-  neg CDS - no free fluid present

## 2023-12-06 ENCOUNTER — Other Ambulatory Visit: Payer: Self-pay | Admitting: Obstetrics & Gynecology

## 2023-12-06 DIAGNOSIS — N840 Polyp of corpus uteri: Secondary | ICD-10-CM

## 2023-12-06 DIAGNOSIS — N950 Postmenopausal bleeding: Secondary | ICD-10-CM

## 2023-12-06 NOTE — Progress Notes (Signed)
-  Patient to review results of recent ultrasound -Discussed findings of endometrial polyp -Recommendation to proceed with hysteroscopy, D&C, polypectomy -Reviewed risk, benefit, alternatives.  Risk include but not limited to risk of bleeding, infection, and uterine perforation requiring further surgical intervention.  Discussed alternative which would be in office EMB -Questions and concerns were addressed -Patient desires to proceed -Referral for surgery completed  Myna Hidalgo, DO Attending Obstetrician & Gynecologist, [facility], [facility]

## 2023-12-07 ENCOUNTER — Encounter: Payer: Self-pay | Admitting: Obstetrics & Gynecology

## 2024-01-25 ENCOUNTER — Other Ambulatory Visit (HOSPITAL_COMMUNITY): Payer: 59

## 2024-02-04 NOTE — Patient Instructions (Signed)
 Your procedure is scheduled on: 02/12/2024  Report to [facility] Main Entrance at 6:00    AM.  Call this number if you have problems the morning of surgery: [phone]   Remember:   Do not Eat after midnight, You may have CLEAR liquids until 3:30 am Water, Soft drink, Tea, or Black coffee NO CREAM OR MILK         No Smoking the morning of surgery  :  Take these medicines the morning of surgery with A SIP OF WATER: Amlodipine, Klonopin, and Effexor   Do not wear jewelry, make-up or nail polish.  Do not wear lotions, powders, or perfumes. You may wear deodorant.  Do not shave 48 hours prior to surgery. Men may shave face and neck.  Do not bring valuables to the hospital.  Contacts, dentures or bridgework may not be worn into surgery.  Leave suitcase in the car. After surgery it may be brought to your room.  For patients admitted to the hospital, checkout time is 11:00 AM the day of discharge.   Patients discharged the day of surgery will not be allowed to drive home.    Special Instructions: Shower using CHG night before surgery and shower the day of surgery use CHG.  Use special wash - you have one bottle of CHG for all showers.  You should use approximately 1/2 of the bottle for each shower. How to Use Chlorhexidine at Home in the Shower Chlorhexidine gluconate (CHG) is a germ-killing (antiseptic) wash that's used to clean the skin. It can get rid of the germs that normally live on the skin and can keep them away for about 24 hours. If you're having surgery, you may be told to shower with CHG at home the night before surgery. This can help lower your risk for infection. To use CHG wash in the shower, follow the steps below. Supplies needed: CHG body wash. Clean washcloth. Clean towel. How to use CHG in the shower Follow these steps unless you're told to use CHG in a different way: Start the shower. Use your normal soap and shampoo to wash your face and hair. Turn off the shower  or move out of the shower stream. Pour CHG onto a clean washcloth. Do not use any type of brush or rough sponge. Start at your neck, washing your body down to your toes. Make sure you: Wash the part of your body where the surgery will be done for at least 1 minute. Do not scrub. Do not use CHG on your head or face unless your health care provider tells you to. If it gets into your ears or eyes, rinse them well with water. Do not wash your genitals with CHG. Wash your back and under your arms. Make sure to wash skin folds. Let the CHG sit on your skin for 1-2 minutes or as long as told. Rinse your entire body in the shower, including all body creases and folds. Turn off the shower. Dry off with a clean towel. Do not put anything on your skin afterward, such as powder, lotion, or perfume. Put on clean clothes or pajamas. If it's the night before surgery, sleep in clean sheets. General tips Use CHG only as told, and follow the instructions on the label. Use the full amount of CHG as told. This is often one bottle. Do not smoke and stay away from flames after using CHG. Your skin may feel sticky after using CHG. This is normal. The sticky feeling  will go away as the CHG dries. Do not use CHG: If you have a chlorhexidine allergy or have reacted to chlorhexidine in the past. On open wounds or areas of skin that have broken skin, cuts, or scrapes. On babies younger than 73 months of age. Contact a health care provider if: You have questions about using CHG. Your skin gets irritated or itchy. You have a rash after using CHG. You swallow any CHG. Call your local poison control center [phone] in the [place] Your eyes itch badly, or they become very red or swollen. Your hearing changes. You have trouble seeing. If you can't reach your provider, go to an urgent care or emergency room. Do not drive yourself. Get help right away if: You have swelling or tingling in your mouth or throat. You  make high-pitched whistling sounds when you breathe, most often when you breathe out (wheeze). You have trouble breathing. These symptoms may be an emergency. Call 911 right away. Do not wait to see if the symptoms will go away. Do not drive yourself to the hospital. This information is not intended to replace advice given to you by your health care provider. Make sure you discuss any questions you have with your health care provider. Document Revised: 04/24/2023 Document Reviewed: 04/20/2022 Elsevier Patient Education  2024 [employer]. Dilation and Curettage, Care After The following information offers guidance on how to care for yourself after your procedure. Your doctor may also give you more specific instructions. If you have problems or questions, contact your doctor. What can I expect after the procedure? After the procedure, it is common to have: Mild pain or cramps. Some bleeding or spotting from the vagina. These may last for up to 2 weeks. Follow these instructions at home: Medicines Take over-the-counter and prescription medicines only as told by your doctor. If told, take steps to prevent problems with pooping (constipation). You may need to: Drink enough fluid to keep your pee (urine) pale yellow. Take medicines. You will be told what medicines to take. Eat foods that are high in fiber. These include beans, whole grains, and fresh fruits and vegetables. Limit foods that are high in fat and sugar. These include fried or sweet foods. Ask your doctor if you should avoid driving or using machines while you are taking your medicine. Activity  If you were given a medicine to help you relax (sedative) during your procedure, it can affect you for many hours. Do not drive or use machinery until your doctor says that it is safe. Rest as told by your doctor. Get up to take short walks every 1-2 hours. Ask for help if you feel weak or unsteady. Do not lift anything that is heavier  than 10 lb (4.5 kg), or the limit that you are told. Return to your normal activities when your doctor says that it is safe. Lifestyle For at least 2 weeks, or as long as told by your doctor: Do not douche. Do not use tampons. Do not have sex. General instructions Do not take baths, swim, or use a hot tub. Ask your doctor if you may take showers. Do not smoke or use any products that contain nicotine or tobacco. These can delay healing. If you need help quitting, ask your doctor. Wear compression stockings as told by your doctor. It is up to you to get the results of your procedure. Ask how to get your results when they are ready. Keep all follow-up visits. Contact a doctor if: You  have very bad cramps that get worse or do not get better with medicine. You have very bad pain in your belly (abdomen). You cannot drink fluids without vomiting. You have pain in the area just above your thighs. You have fluid from your vagina that smells bad. You have a rash. Get help right away if: You are bleeding a lot from your vagina. This means soaking more than one sanitary pad in 1 hour, and this happens for 2 hours in a row. You have a fever that is above 100.57F (38C). Your belly feels very tender or hard. You have chest pain. You have trouble breathing. You feel dizzy or light-headed. You faint. You have pain in your neck or shoulder area. These symptoms may be an emergency. Get help right away. Call your local emergency services (911 in the [place] Do not wait to see if the symptoms will go away. Do not drive yourself to the hospital. Summary After your procedure, it is common to have pain or cramping. It is also common to have bleeding or spotting from your vagina. Rest as told. Get up to take short walks every 1-2 hours. Do not lift anything that is heavier than 10 lb (4.5 kg), or the limit that you are told. Get help right away if you have problems from the procedure. Ask your doctor  what problems to watch for. This information is not intended to replace advice given to you by your health care provider. Make sure you discuss any questions you have with your health care provider. Document Revised: 09/27/2020 Document Reviewed: 09/29/2020 Elsevier Patient Education  2024 Elsevier Inc. Hysteroscopy Hysteroscopy is a procedure used to look inside a person's uterus. It should be done right after your period. You may have this procedure if your health care provider wants to: Look for tumors and other growths in the uterus. Know more about your bleeding, fibroids, tumors, polyps, scar tissue, or cancer in the uterus. Know why you aren't able to get pregnant, or why you lose your pregnancies. Find an intrauterine device (IUD) in your body. Put a birth control device in the fallopian tubes. Tell a health care provider about: Any allergies you have. All medicines you are taking. These include vitamins, herbs, eye drops, creams, and over-the-counter medicines. Any problems you or family members have had with anesthesia. Any bleeding problems you have. Any medical conditions or surgeries you've had. Whether you're pregnant or may be pregnant. Whether you have or have had a sexually transmitted infection (STI), or you think you have an STI. What are the risks? Your provider will talk with you about risks. These may include: Bleeding that's worse. Infection. Damage to parts of the uterus or other organs. Allergies to medicines or fluids that are used in the procedure. What happens before the procedure? When to stop eating and drinking Follow instructions from your provider about what you may eat and drink. These may include: 8 hours before your procedure Stop eating most foods. Do not eat meat, fried foods, or fatty foods. Eat only light foods, such as toast or crackers. All liquids are okay except energy drinks and alcohol. 6 hours before your procedure Stop eating. Drink  only clear liquids, such as water, clear fruit juice, black coffee, plain tea, and sports drinks. Do not drink energy drinks or alcohol. 2 hours before your procedure Stop drinking all liquids. You may be allowed to take medicines with small sips of water. If you do not follow your provider's instructions,  your procedure may be delayed or canceled. Medicines Ask your provider about: Changing or stopping your regular medicines. These include any diabetes medicines or blood thinners you take. Taking medicines such as aspirin and ibuprofen. These medicines can thin your blood. Do not take them unless your provider tells you to. Taking over-the-counter medicines, vitamins, herbs, and supplements. Medicine may be placed in your cervix the day before the procedure. This medicine causes the cervix to open (dilate). The larger opening makes it easier for the hysteroscope to be inserted into the uterus. General instructions Ask your provider what steps will be taken to help prevent infection. These steps may include: Washing skin with a soap that kills germs. Taking antibiotic medicine. Do not smoke, vape, or use any products that have nicotine or tobacco for at least 4 weeks before the surgery. If you need help quitting, ask your provider. If you will be going home right after the procedure, plan to have an adult: Take you home from the hospital or clinic. You will not be allowed to drive. Care for you for the time you are told. Pee before the procedure begins. What happens during the procedure? You may be given: A sedative. This helps you relax. Anesthesia. This keeps you from feeling pain. It will make you fall asleep for surgery. A small tube with a light and camera called a hysteroscope will be put into your uterus. The camera sends images to a screen in the room so your provider can see the inside of your uterus. Air or fluid will be used to enlarge your uterus to allow your provider to see  it better. In some cases, tissue may be gently scraped from inside the uterus and sent to a lab for testing (biopsy). The procedure may vary among providers and hospitals. What happens after the procedure? Your blood pressure, heart rate, breathing rate, and blood oxygen level will be monitored until you leave the hospital or clinic. You may have cramps. You may be given medicines for this. You may have bleeding. Bleeding may be light or heavy. This is normal. If you had a biopsy, it is up to you to get the results. Ask your provider, or the department that is doing the procedure, when your results will be ready. This information is not intended to replace advice given to you by your health care provider. Make sure you discuss any questions you have with your health care provider. Document Revised: 02/09/2023 Document Reviewed: 02/09/2023 Elsevier Patient Education  2024 Else[employer] Inc. General Anesthesia, Adult, Care After The following information offers guidance on how to care for yourself after your procedure. Your health care provider may also give you more specific instructions. If you have problems or questions, contact your health care provider. What can I expect after the procedure? After the procedure, it is common for people to: Have pain or discomfort at the IV site. Have nausea or vomiting. Have a sore throat or hoarseness. Have trouble concentrating. Feel cold or chills. Feel weak, sleepy, or tired (fatigue). Have soreness and body aches. These can affect parts of the body that were not involved in surgery. Follow these instructions at home: For the time period you were told by your health care provider:  Rest. Do not participate in activities where you could fall or become injured. Do not drive or use machinery. Do not drink alcohol. Do not take sleeping pills or medicines that cause drowsiness. Do not make important decisions or sign legal documents. Do not  take care of  children on your own. General instructions Drink enough fluid to keep your urine pale yellow. If you have sleep apnea, surgery and certain medicines can increase your risk for breathing problems. Follow instructions from your health care provider about wearing your sleep device: Anytime you are sleeping, including during daytime naps. While taking prescription pain medicines, sleeping medicines, or medicines that make you drowsy. Return to your normal activities as told by your health care provider. Ask your health care provider what activities are safe for you. Take over-the-counter and prescription medicines only as told by your health care provider. Do not use any products that contain nicotine or tobacco. These products include cigarettes, chewing tobacco, and vaping devices, such as e-cigarettes. These can delay incision healing after surgery. If you need help quitting, ask your health care provider. Contact a health care provider if: You have nausea or vomiting that does not get better with medicine. You vomit every time you eat or drink. You have pain that does not get better with medicine. You cannot urinate or have bloody urine. You develop a skin rash. You have a fever. Get help right away if: You have trouble breathing. You have chest pain. You vomit blood. These symptoms may be an emergency. Get help right away. Call 911. Do not wait to see if the symptoms will go away. Do not drive yourself to the hospital. Summary After the procedure, it is common to have a sore throat, hoarseness, nausea, vomiting, or to feel weak, sleepy, or fatigue. For the time period you were told by your health care provider, do not drive or use machinery. Get help right away if you have difficulty breathing, have chest pain, or vomit blood. These symptoms may be an emergency. This information is not intended to replace advice given to you by your health care provider. Make sure you discuss any  questions you have with your health care provider. Document Revised: 01/06/2022 Document Reviewed: 01/06/2022 Elsevier Patient Education  2024 [employer].

## 2024-02-07 ENCOUNTER — Encounter: Payer: 59 | Admitting: Obstetrics & Gynecology

## 2024-02-07 ENCOUNTER — Encounter (HOSPITAL_COMMUNITY)
Admission: RE | Admit: 2024-02-07 | Discharge: 2024-02-07 | Disposition: A | Discharge status: Home / Self Care | Source: Ambulatory Visit | Attending: Obstetrics & Gynecology | Admitting: Obstetrics & Gynecology

## 2024-02-07 ENCOUNTER — Encounter (HOSPITAL_COMMUNITY): Payer: Self-pay

## 2024-02-07 VITALS — BP 130/75 | HR 77 | Temp 98.0°F | Resp 18 | Ht 68.0 in | Wt 220.9 lb

## 2024-02-07 DIAGNOSIS — I10 Essential (primary) hypertension: Secondary | ICD-10-CM | POA: Insufficient documentation

## 2024-02-07 DIAGNOSIS — Z01810 Encounter for preprocedural cardiovascular examination: Secondary | ICD-10-CM | POA: Diagnosis present

## 2024-02-07 DIAGNOSIS — N840 Polyp of corpus uteri: Secondary | ICD-10-CM | POA: Diagnosis not present

## 2024-02-07 DIAGNOSIS — Z01818 Encounter for other preprocedural examination: Secondary | ICD-10-CM | POA: Insufficient documentation

## 2024-02-07 DIAGNOSIS — Z79899 Other long term (current) drug therapy: Secondary | ICD-10-CM | POA: Diagnosis not present

## 2024-02-07 DIAGNOSIS — Z01812 Encounter for preprocedural laboratory examination: Secondary | ICD-10-CM | POA: Diagnosis present

## 2024-02-07 LAB — CBC
HCT: 33.7 % — ABNORMAL LOW (ref 36.0–46.0)
Hemoglobin: 11.4 g/dL — ABNORMAL LOW (ref 12.0–15.0)
MCH: 33.6 pg (ref 26.0–34.0)
MCHC: 33.8 g/dL (ref 30.0–36.0)
MCV: 99.4 fL (ref 80.0–100.0)
Platelets: 229 10*3/uL (ref 150–400)
RBC: 3.39 MIL/uL — ABNORMAL LOW (ref 3.87–5.11)
RDW: 12.4 % (ref 11.5–15.5)
WBC: 4.1 10*3/uL (ref 4.0–10.5)
nRBC: 0 % (ref 0.0–0.2)

## 2024-02-07 LAB — BASIC METABOLIC PANEL WITH GFR
Anion gap: 11 (ref 5–15)
BUN: 24 mg/dL — ABNORMAL HIGH (ref 6–20)
CO2: 23 mmol/L (ref 22–32)
Calcium: 9.2 mg/dL (ref 8.9–10.3)
Chloride: 100 mmol/L (ref 98–111)
Creatinine, Ser: 0.77 mg/dL (ref 0.44–1.00)
GFR, Estimated: >60 mL/min (ref >60)
Glucose, Bld: 116 mg/dL — ABNORMAL HIGH (ref 70–99)
Potassium: 3.4 mmol/L — ABNORMAL LOW (ref 3.5–5.1)
Sodium: 134 mmol/L — ABNORMAL LOW (ref 135–145)

## 2024-02-11 NOTE — H&P (Signed)
 [facility] Attending History and Physical  Brianna Lopez is a 58 y.o. G1P1 who presents for scheduled hysteroscopy, D&C, possible polypectomy  In review, pt had initially presented due to postmenopausal bleeding.  While a cervical polyp was initially seen on exam.  Pelvic ultrasound on 12/04/2023 showed: 4 x 2.7 x 3.5 cc uterus thickened endometrium 8.4 cm with suspected polyp.  Normal ovaries bilaterally  Denies any abnormal vaginal discharge, fevers, chills, sweats, dysuria, nausea, vomiting, other GI or GU symptoms or other general symptoms.  Past Medical History:  Diagnosis Date   Asthma    Elevated liver enzymes    Fatty liver    High cholesterol    Hypertension    Vitamin D deficiency    Past Surgical History:  Procedure Laterality Date   ABDOMINAL HERNIA REPAIR     APPENDECTOMY     CESAREAN SECTION     left wrist surgery     right shoulder surgery     OB History  Gravida Para Term Preterm AB Living  1 1    1   SAB IAB Ectopic Multiple Live Births          # Outcome Date GA Lbr Len/2nd Weight Sex Type Anes PTL Lv  1 Para           Patient denies any other pertinent gynecologic issues.  No current facility-administered medications on file prior to encounter.   Current Outpatient Medications on File Prior to Encounter  Medication Sig Dispense Refill   amLODipine (NORVASC) 5 MG tablet Take 5 mg by mouth daily.     bumetanide (BUMEX) 0.5 MG tablet Take 0.5 mg by mouth daily as needed (Edema).     clonazePAM (KLONOPIN) 0.5 MG tablet Take 0.5 mg by mouth daily as needed for anxiety.     hydrochlorothiazide (HYDRODIURIL) 25 MG tablet Take 25 mg by mouth daily.     ibuprofen (ADVIL) 200 MG tablet Take 200-800 mg by mouth every 6 (six) hours as needed for moderate pain (pain score 4-6).     losartan (COZAAR) 100 MG tablet Take 100 mg by mouth daily.     Probiotic Product (PROBIOTIC PO) Take 1 Dose by mouth daily.     rosuvastatin (CRESTOR) 20 MG  tablet Take 20 mg by mouth daily.     venlafaxine XR (EFFEXOR-XR) 150 MG 24 hr capsule Take 150 mg by mouth daily.     venlafaxine XR (EFFEXOR-XR) 75 MG 24 hr capsule Take 75 mg by mouth daily with breakfast.     Vitamin D, Ergocalciferol, (DRISDOL) 1.25 MG (50000 UNIT) CAPS capsule Take 50,000 Units by mouth every 7 (seven) days.     Allergies  Allergen Reactions   Nitrofurantoin Macrocrystal Other (See Comments)    Fever/Flu sx    Social History:   reports that she has quit smoking. Her smoking use included cigarettes. She has never used smokeless tobacco. She reports current alcohol use. She reports that she does not use drugs. Family History  Problem Relation Age of Onset   Diabetes Father    Heart attack Father    Diabetes Mother    Breast cancer Mother     Review of Systems: Pertinent items noted in HPI and remainder of comprehensive ROS otherwise negative.  PHYSICAL EXAM: There were no vitals taken for this visit. *** CONSTITUTIONAL: Well-developed, well-nourished female in no acute distress.  SKIN: Skin is warm and dry. No rash noted. Not diaphoretic. No erythema. No pallor. NEUROLOGIC:  Alert and oriented to person, place, and time. Normal reflexes, muscle tone coordination. No cranial nerve deficit noted. PSYCHIATRIC: Normal mood and affect. Normal behavior. Normal judgment and thought content. CARDIOVASCULAR: Normal heart rate noted, regular rhythm RESPIRATORY: Effort and breath sounds normal, no problems with respiration noted ABDOMEN: Soft, nontender, nondistended. PELVIC: deferred MUSCULOSKELETAL: no calf tenderness bilaterally EXT: no edema bilaterally, normal pulses  Labs: Results for orders placed or performed during the hospital encounter of 02/07/24 (from the past 2 weeks)  Basic metabolic panel   Collection Time: 02/07/24  8:42 AM  Result Value Ref Range   Sodium 134 (L) 135 - 145 mmol/L   Potassium 3.4 (L) 3.5 - 5.1 mmol/L   Chloride 100 98 - 111 mmol/L    CO2 23 22 - 32 mmol/L   Glucose, Bld 116 (H) 70 - 99 mg/dL   BUN 24 (H) 6 - 20 mg/dL   Creatinine, Ser 1.61 0.44 - 1.00 mg/dL   Calcium 9.2 8.9 - 09.6 mg/dL   GFR, Estimated >04 >54 mL/min   Anion gap 11 5 - 15  CBC   Collection Time: 02/07/24  8:42 AM  Result Value Ref Range   WBC 4.1 4.0 - 10.5 K/uL   RBC 3.39 (L) 3.87 - 5.11 MIL/uL   Hemoglobin 11.4 (L) 12.0 - 15.0 g/dL   HCT 09.8 (L) 11.9 - 14.7 %   MCV 99.4 80.0 - 100.0 fL   MCH 33.6 26.0 - 34.0 pg   MCHC 33.8 30.0 - 36.0 g/dL   RDW 82.9 56.2 - 13.0 %   Platelets 229 150 - 400 K/uL   nRBC 0.0 0.0 - 0.2 %    Imaging Studies: 11/2023:   4.0 x 2.7 x 3.5 cm, Total uterine volume 19.9 cc Mid plane uterus normal in size - no evidence of fibroids, myometrium appears symmetrical and homogeneous.  8.6mm endometrium. Both venous and arterial flow seen within this mass measuring approximately 31 x 7 x 17 mm. (Likely polyp) There is an echofree simple cyst within this mass = 7 mm Peristalsis within cavity is seen with soft tissue mass slowly moving.   Assessment: Postmenopausal bleeding Endometrial polyp  Plan: Hysteroscopy, D&C, polypectomy -NPO -LR @ 125cc/hr -SCDs to OR -Risk/benefits and alternatives reviewed with the patient including but not limited to risk of bleeding, infection and injury to surrounding organs due to uterine perforation requiring further surgical intervention..  Questions and concerns were addressed and pt desires to proceed  [name], DO Attending Obstetrician & Gynecologist, [facility] for [facility], [facility]

## 2024-02-12 ENCOUNTER — Ambulatory Visit (HOSPITAL_COMMUNITY)
Admission: RE | Admit: 2024-02-12 | Discharge: 2024-02-12 | Disposition: A | Discharge status: Home / Self Care | Payer: 59 | Attending: Obstetrics & Gynecology | Admitting: Obstetrics & Gynecology

## 2024-02-12 ENCOUNTER — Encounter (HOSPITAL_COMMUNITY)
Admission: RE | Disposition: A | Discharge status: Home / Self Care | Payer: Self-pay | Source: Home / Self Care | Attending: Obstetrics & Gynecology

## 2024-02-12 ENCOUNTER — Other Ambulatory Visit: Payer: Self-pay

## 2024-02-12 ENCOUNTER — Ambulatory Visit (HOSPITAL_BASED_OUTPATIENT_CLINIC_OR_DEPARTMENT_OTHER): Admitting: Certified Registered"

## 2024-02-12 ENCOUNTER — Encounter (HOSPITAL_COMMUNITY): Payer: Self-pay | Admitting: Obstetrics & Gynecology

## 2024-02-12 ENCOUNTER — Ambulatory Visit (HOSPITAL_COMMUNITY): Admitting: Certified Registered"

## 2024-02-12 DIAGNOSIS — N840 Polyp of corpus uteri: Secondary | ICD-10-CM | POA: Insufficient documentation

## 2024-02-12 DIAGNOSIS — F32A Depression, unspecified: Secondary | ICD-10-CM | POA: Insufficient documentation

## 2024-02-12 DIAGNOSIS — N950 Postmenopausal bleeding: Secondary | ICD-10-CM | POA: Diagnosis present

## 2024-02-12 DIAGNOSIS — I10 Essential (primary) hypertension: Secondary | ICD-10-CM | POA: Diagnosis not present

## 2024-02-12 DIAGNOSIS — G4730 Sleep apnea, unspecified: Secondary | ICD-10-CM | POA: Insufficient documentation

## 2024-02-12 DIAGNOSIS — Z01818 Encounter for other preprocedural examination: Secondary | ICD-10-CM

## 2024-02-12 DIAGNOSIS — F419 Anxiety disorder, unspecified: Secondary | ICD-10-CM | POA: Diagnosis not present

## 2024-02-12 DIAGNOSIS — Z87891 Personal history of nicotine dependence: Secondary | ICD-10-CM | POA: Diagnosis not present

## 2024-02-12 HISTORY — PX: DILATATION & CURRETTAGE/HYSTEROSCOPY WITH RESECTOCOPE: SHX5572

## 2024-02-12 HISTORY — DX: Sleep apnea, unspecified: G47.30

## 2024-02-12 SURGERY — DILATATION & CURETTAGE/HYSTEROSCOPY WITH RESECTOCOPE
Anesthesia: General | Site: Vagina

## 2024-02-12 MED ORDER — ORAL CARE MOUTH RINSE: 15.0000 mL | Freq: Once | OROMUCOSAL | Status: AC

## 2024-02-12 MED ORDER — FENTANYL CITRATE PF 50 MCG/ML IJ SOSY: 25.0000 ug | PREFILLED_SYRINGE | INTRAMUSCULAR | Status: DC | PRN

## 2024-02-12 MED ORDER — KETOROLAC TROMETHAMINE 15 MG/ML IJ SOLN
15.0000 mg | INTRAMUSCULAR | Status: AC
  Administered 2024-02-12: 15 mg via INTRAVENOUS

## 2024-02-12 MED ORDER — DEXAMETHASONE SODIUM PHOSPHATE 10 MG/ML IJ SOLN
INTRAMUSCULAR | Status: AC
  Filled 2024-02-12: qty 1

## 2024-02-12 MED ORDER — PROPOFOL 10 MG/ML IV BOLUS
INTRAVENOUS | Status: AC
  Filled 2024-02-12: qty 20

## 2024-02-12 MED ORDER — LIDOCAINE HCL (PF) 2 % IJ SOLN
INTRAMUSCULAR | Status: AC
  Filled 2024-02-12: qty 5

## 2024-02-12 MED ORDER — DEXMEDETOMIDINE HCL IN NACL 80 MCG/20ML IV SOLN
INTRAVENOUS | Status: DC | PRN
  Administered 2024-02-12: 8 ug via INTRAVENOUS

## 2024-02-12 MED ORDER — EPHEDRINE SULFATE-NACL 50-0.9 MG/10ML-% IV SOSY
PREFILLED_SYRINGE | INTRAVENOUS | Status: DC | PRN
  Administered 2024-02-12 (×2): 10 mg via INTRAVENOUS
  Administered 2024-02-12: 5 mg via INTRAVENOUS

## 2024-02-12 MED ORDER — CHLORHEXIDINE GLUCONATE 0.12 % MT SOLN
15.0000 mL | Freq: Once | OROMUCOSAL | Status: AC
  Administered 2024-02-12: 15 mL via OROMUCOSAL

## 2024-02-12 MED ORDER — ONDANSETRON HCL 4 MG/2ML IJ SOLN
INTRAMUSCULAR | Status: AC
Start: 2024-02-12 — End: ?
  Filled 2024-02-12: qty 2

## 2024-02-12 MED ORDER — PROPOFOL 10 MG/ML IV BOLUS
INTRAVENOUS | Status: DC | PRN
  Administered 2024-02-12: 250 mg via INTRAVENOUS

## 2024-02-12 MED ORDER — LIDOCAINE-EPINEPHRINE 0.5 %-1:200000 IJ SOLN
INTRAMUSCULAR | Status: AC
  Filled 2024-02-12: qty 50

## 2024-02-12 MED ORDER — DEXAMETHASONE SODIUM PHOSPHATE 10 MG/ML IJ SOLN
INTRAMUSCULAR | Status: DC | PRN
  Administered 2024-02-12: 8 mg via INTRAVENOUS

## 2024-02-12 MED ORDER — LIDOCAINE-EPINEPHRINE 0.5 %-1:200000 IJ SOLN
INTRAMUSCULAR | Status: DC | PRN
  Administered 2024-02-12: 10 mL
  Administered 2024-02-12: 9 mL

## 2024-02-12 MED ORDER — OXYCODONE HCL 5 MG PO TABS: 5.0000 mg | ORAL_TABLET | Freq: Once | ORAL | Status: DC | PRN

## 2024-02-12 MED ORDER — LACTATED RINGERS IV SOLN: INTRAVENOUS | Status: DC

## 2024-02-12 MED ORDER — KETOROLAC TROMETHAMINE 30 MG/ML IJ SOLN
INTRAMUSCULAR | Status: AC
  Filled 2024-02-12: qty 1

## 2024-02-12 MED ORDER — MIDAZOLAM HCL 2 MG/2ML IJ SOLN
INTRAMUSCULAR | Status: AC
  Filled 2024-02-12: qty 2

## 2024-02-12 MED ORDER — POVIDONE-IODINE 10 % EX SWAB
2.0000 | Freq: Once | CUTANEOUS | Status: AC
  Administered 2024-02-12: 2 via TOPICAL

## 2024-02-12 MED ORDER — FENTANYL CITRATE (PF) 100 MCG/2ML IJ SOLN
INTRAMUSCULAR | Status: DC | PRN
  Administered 2024-02-12 (×4): 25 ug via INTRAVENOUS

## 2024-02-12 MED ORDER — MIDAZOLAM HCL 2 MG/2ML IJ SOLN
INTRAMUSCULAR | Status: DC | PRN
Start: 2024-02-12 — End: 2024-02-12
  Administered 2024-02-12: 2 mg via INTRAVENOUS

## 2024-02-12 MED ORDER — FENTANYL CITRATE (PF) 100 MCG/2ML IJ SOLN
INTRAMUSCULAR | Status: AC
  Filled 2024-02-12: qty 2

## 2024-02-12 MED ORDER — OXYCODONE HCL 5 MG/5ML PO SOLN: 5.0000 mg | Freq: Once | ORAL | Status: DC | PRN

## 2024-02-12 MED ORDER — LIDOCAINE HCL (PF) 2 % IJ SOLN
INTRAMUSCULAR | Status: DC | PRN
  Administered 2024-02-12: 100 mg via INTRADERMAL

## 2024-02-12 MED ORDER — PHENYLEPHRINE 80 MCG/ML (10ML) SYRINGE FOR IV PUSH (FOR BLOOD PRESSURE SUPPORT)
PREFILLED_SYRINGE | INTRAVENOUS | Status: DC | PRN
Start: 2024-02-12 — End: 2024-02-12
  Administered 2024-02-12 (×2): 160 ug via INTRAVENOUS
  Administered 2024-02-12: 80 ug via INTRAVENOUS
  Administered 2024-02-12: 160 ug via INTRAVENOUS

## 2024-02-12 MED ORDER — ONDANSETRON HCL 4 MG/2ML IJ SOLN
INTRAMUSCULAR | Status: DC | PRN
  Administered 2024-02-12: 4 mg via INTRAVENOUS

## 2024-02-12 MED ORDER — SODIUM CHLORIDE 0.9 % IR SOLN
Status: DC | PRN
  Administered 2024-02-12: 1000 mL

## 2024-02-12 SURGICAL SUPPLY — 23 items
COVER LIGHT HANDLE STERIS (MISCELLANEOUS) ×3 IMPLANT
DEVICE MYOSURE LITE (MISCELLANEOUS) IMPLANT
DRSG TELFA 3X8 NADH STRL (GAUZE/BANDAGES/DRESSINGS) IMPLANT
GAUZE 4X4 16PLY ~~LOC~~+RFID DBL (SPONGE) ×2 IMPLANT
GLOVE BIO SURGEON STRL SZ 6.5 (GLOVE) ×1 IMPLANT
GLOVE BIOGEL PI IND STRL 7.0 (GLOVE) ×3 IMPLANT
GOWN STRL REUS W/ TWL LRG LVL3 (GOWN DISPOSABLE) ×1 IMPLANT
GOWN STRL REUS W/TWL LRG LVL3 (GOWN DISPOSABLE) ×1 IMPLANT
KIT PROCEDURE FLUENT (KITS) ×1 IMPLANT
KIT TURNOVER CYSTO (KITS) ×1 IMPLANT
NDL HYPO 18GX1.5 BLUNT FILL (NEEDLE) ×1 IMPLANT
NEEDLE HYPO 18GX1.5 BLUNT FILL (NEEDLE) ×1 IMPLANT
NS IRRIG 1000ML POUR BTL (IV SOLUTION) ×1 IMPLANT
PACK PERI GYN (CUSTOM PROCEDURE TRAY) ×1 IMPLANT
PAD ARMBOARD POSITIONER FOAM (MISCELLANEOUS) ×1 IMPLANT
PAD TELFA 3X4 1S STER (GAUZE/BANDAGES/DRESSINGS) ×1 IMPLANT
POSITIONER HEAD 8X9X4 ADT (SOFTGOODS) ×1 IMPLANT
SEAL ROD LENS SCOPE MYOSURE (ABLATOR) ×1 IMPLANT
SET BASIN LINEN APH (SET/KITS/TRAYS/PACK) ×1 IMPLANT
SOL .9 NS 3000ML IRR UROMATIC (IV SOLUTION) ×1 IMPLANT
SYR 30ML LL (SYRINGE) ×1 IMPLANT
SYR CONTROL 10ML LL (SYRINGE) ×1 IMPLANT
UNDERPAD 30X36 HEAVY ABSORB (UNDERPADS AND DIAPERS) ×1 IMPLANT

## 2024-02-12 NOTE — Anesthesia Procedure Notes (Addendum)
 Procedure Name: LMA Insertion Date/Time: 02/12/2024 7:41 AM  Performed by: Juluis Ok, CRNAPre-anesthesia Checklist: Patient identified, Emergency Drugs available, Suction available and Patient being monitored Patient Re-evaluated:Patient Re-evaluated prior to induction Oxygen Delivery Method: Circle system utilized Preoxygenation: Pre-oxygenation with 100% oxygen Induction Type: IV induction Ventilation: Mask ventilation without difficulty LMA: LMA inserted LMA Size: 4.0 Number of attempts: 1 Placement Confirmation: positive ETCO2, CO2 detector and breath sounds checked- equal and bilateral Tube secured with: Tape Dental Injury: Teeth and Oropharynx as per pre-operative assessment  Comments: Atraumatic insertion of LMA size #4. Lips and teeth remain in preoperative condition.

## 2024-02-12 NOTE — Transfer of Care (Addendum)
 Immediate Anesthesia Transfer of Care Note  Patient: Brianna Lopez  Procedure(s) Performed: DILATATION & CURETTAGE/HYSTEROSCOPY WITH RESECTOCOPE (Vagina )  Patient Location: PACU  Anesthesia Type:General  Level of Consciousness: awake and patient cooperative  Airway & Oxygen Therapy: Patient Spontanous Breathing and Patient connected to nasal cannula oxygen  Post-op Assessment: Report given to RN and Post -op Vital signs reviewed and stable  Post vital signs: Reviewed and stable  Last Vitals:  Vitals Value Taken Time  BP 101/56 02/12/24 0820  Temp 36.8 02/12/24   0821  Pulse 87 02/12/24 0822  Resp 17 02/12/24 0822  SpO2 98 % 02/12/24 0822  Vitals shown include unfiled device data.  Last Pain:  Vitals:   02/12/24 0655  TempSrc:   PainSc: 0-No pain      Patients Stated Pain Goal: 8 (02/12/24 0640)  Complications: No notable events documented.

## 2024-02-12 NOTE — Discharge Instructions (Addendum)
 HOME INSTRUCTIONS  Please note any unusual or excessive bleeding, pain, swelling. Mild dizziness or drowsiness are normal for about 24 hours after surgery.   Shower when comfortable  Restrictions: No driving for 24 hours or while taking pain medications.  Activity:  Nothing in vagina (no tampons, douching, or intercourse) x 2 weeks; no tub baths for 2 weeks Vaginal spotting is expected but if your bleeding is heavy, period like,  please call the office   Diet:  You may return to your regular diet.  Do not eat large meals.  Eat small frequent meals throughout the day.  Continue to drink a good amount of water at least 6-8 glasses of water per day, hydration is very important for the healing process.  Pain Management: Take over the counter tylenol  or ibuprofen as needed for pain.  You can either take one or alternate between the two medications for pain management.  You may also use a heating pack as needed.    Alcohol -- Avoid for 24 hours and while taking pain medications.  Nausea: Take sips of ginger ale or soda  Fever -- Call physician if temperature over 101 degrees  Follow up:  You do not need a follow up appointment unless you have any concerns. Please call the office at [phone].  If you experience fever (a temperature greater than 100.4), pain unrelieved by pain medication, shortness of breath, swelling of a single leg, or any other symptoms which are concerning to you please the office immediately.

## 2024-02-12 NOTE — Anesthesia Preprocedure Evaluation (Signed)
 Anesthesia Evaluation  Patient identified by MRN, date of birth, ID band Patient awake    Reviewed: Allergy & Precautions, H&P , NPO status , Patient's Chart, lab work & pertinent test results, reviewed documented beta blocker date and time   Airway Mallampati: II  TM Distance: >3 FB Neck ROM: full    Dental no notable dental hx.    Pulmonary asthma , sleep apnea , former smoker   Pulmonary exam normal breath sounds clear to auscultation       Cardiovascular Exercise Tolerance: Good hypertension,  Rhythm:regular Rate:Normal     Neuro/Psych  PSYCHIATRIC DISORDERS Anxiety Depression    negative neurological ROS     GI/Hepatic negative GI ROS, Neg liver ROS,,,  Endo/Other  negative endocrine ROS    Renal/GU negative Renal ROS  negative genitourinary   Musculoskeletal   Abdominal   Peds  Hematology negative hematology ROS (+)   Anesthesia Other Findings   Reproductive/Obstetrics negative OB ROS                             Anesthesia Physical Anesthesia Plan  ASA: 2  Anesthesia Plan: General and General LMA   Post-op Pain Management:    Induction:   PONV Risk Score and Plan: Ondansetron   Airway Management Planned:   Additional Equipment:   Intra-op Plan:   Post-operative Plan:   Informed Consent: I have reviewed the patients History and Physical, chart, labs and discussed the procedure including the risks, benefits and alternatives for the proposed anesthesia with the patient or authorized representative who has indicated his/her understanding and acceptance.     Dental Advisory Given  Plan Discussed with: CRNA  Anesthesia Plan Comments:        Anesthesia Quick Evaluation

## 2024-02-12 NOTE — Op Note (Signed)
 Operative Report  PreOp: Postmenopausal bleeding, endometrial polyp PostOp: same Procedure:  Hysteroscopy, Dilation and Curettage, Polypectomy Surgeon: Dr. Keene Pastures Anesthesia: MAC Complications:none EBL: Minimal IVF:900cc Discrepancy: 105cc  Findings: 4cm uterus with two endometrial polyps  Both ostia visualized.  Specimens: endometrial curettings with endometrial polyp  Procedure: The patient was taken to the operating room where she underwent MAC anesthesia without difficulty. The patient was placed in a low lithotomy position using Allen stirrups. She was then prepped and draped in the normal sterile fashion. Sterile speculum was placed.  A single tooth tenaculum was placed on the anterior lip of the cervix. Cervical block was completed using 0.5% lidocaine  with epinephrine - 20cc.  The uterus was then sounded to 4.5cm. The endocervical canal was then serially dilated to 23French using Hank dilators to accommodate the hysteroscopic apparatus.  The hysteroscope was inserted and findings were visualized as noted above.  The myosure was then introduced and resection of both polyps with curetting was completed. The tissue was sent to pathology.   All instrument were then removed. Hemostasis was observed at the cervical site. The patient was repositioned to the supine position. The patient tolerated the procedure without any complications and taken to recovery in stable condition.   Sherrina Zaugg, DO Attending Obstetrician & Gynecologist, [facility] for [employer], [facility]

## 2024-02-13 ENCOUNTER — Encounter (HOSPITAL_COMMUNITY): Payer: Self-pay | Admitting: Obstetrics & Gynecology

## 2024-02-13 LAB — SURGICAL PATHOLOGY

## 2024-02-15 NOTE — Anesthesia Postprocedure Evaluation (Signed)
 Anesthesia Post Note  Patient: Brianna Lopez  Procedure(s) Performed: HYSTEROSCOPY WITH RESECTOCOPE (Vagina )  Patient location during evaluation: Phase II Anesthesia Type: General Level of consciousness: awake Pain management: pain level controlled Vital Signs Assessment: post-procedure vital signs reviewed and stable Respiratory status: spontaneous breathing and respiratory function stable Cardiovascular status: blood pressure returned to baseline and stable Postop Assessment: no headache and no apparent nausea or vomiting Anesthetic complications: no Comments: Late entry   No notable events documented.   Last Vitals:  Vitals:   02/12/24 0839 02/12/24 0848  BP: 100/63 107/70  Pulse: 85 80  Resp: 15 17  Temp:  36.6 C  SpO2: 96% 96%    Last Pain:  Vitals:   02/12/24 0848  TempSrc: Oral  PainSc: 0-No pain                 Coretha Dew

## 2024-02-20 ENCOUNTER — Encounter: Admitting: Obstetrics & Gynecology

## 2024-06-27 NOTE — Progress Notes (Signed)
 [facility] [address] [place], [place] [address]  Pulmonary Sleep Medicine   Office Visit Note  Patient Name: Brianna Lopez DOB: [birth year] MRN [id number]    Chief Complaint: Obstructive Sleep Apnea visit  Brief History:  Patrcia is seen today for an annual follow up visit for APAP@ 5-20 cmH2O. The patient has a 2 year history of sleep apnea. Patient is using PAP nightly.  The patient feels rested after sleeping with PAP.  The patient reports benefiting from PAP use. Reported sleepiness is  improved and the Epworth Sleepiness Score is 0 out of 24. The patient does not take naps. The patient complains of the following: none.  The compliance download shows 77% compliance with an average use time of 7 hours 15 minutes. The AHI is 4.2.  The patient does not complain of limb movements disrupting sleep. The patient continues to require PAP therapy in order to eliminate sleep apnea.   ROS  General: (-) fever, (-) chills, (-) night sweat Nose and Sinuses: (-) nasal stuffiness or itchiness, (-) postnasal drip, (-) nosebleeds, (-) sinus trouble. Mouth and Throat: (-) sore throat, (-) hoarseness. Neck: (-) swollen glands, (-) enlarged thyroid, (-) neck pain. Respiratory: - cough, - shortness of breath, - wheezing. Neurologic: - numbness, - tingling. Psychiatric: + anxiety, + depression   Current Medication: Outpatient Encounter Medications as of 06/30/2024  Medication Sig Note   rosuvastatin (CRESTOR) 40 MG tablet Take 40 mg by mouth daily.    amLODipine (NORVASC) 5 MG tablet Take 5 mg by mouth daily.    bumetanide (BUMEX) 0.5 MG tablet Take 0.5 mg by mouth daily as needed (Edema).    clonazePAM (KLONOPIN) 0.5 MG tablet Take 0.5 mg by mouth daily as needed for anxiety.    hydrochlorothiazide (HYDRODIURIL) 25 MG tablet Take 25 mg by mouth daily.    ibuprofen (ADVIL) 200 MG tablet Take 200-800 mg by mouth every 6 (six) hours as needed for moderate pain (pain score 4-6).     losartan (COZAAR) 100 MG tablet Take 100 mg by mouth daily.    Probiotic Product (PROBIOTIC PO) Take 1 Dose by mouth daily.    venlafaxine XR (EFFEXOR-XR) 150 MG 24 hr capsule Take 150 mg by mouth daily. 02/01/2024: Along with 75mg .   venlafaxine XR (EFFEXOR-XR) 75 MG 24 hr capsule Take 75 mg by mouth daily with breakfast. 02/01/2024: Along with 150mg .    Vitamin D, Ergocalciferol, (DRISDOL) 1.25 MG (50000 UNIT) CAPS capsule Take 50,000 Units by mouth every 7 (seven) days.    [DISCONTINUED] rosuvastatin (CRESTOR) 20 MG tablet Take 20 mg by mouth daily.    No facility-administered encounter medications on file as of 06/30/2024.    Surgical History: Past Surgical History:  Procedure Laterality Date   ABDOMINAL HERNIA REPAIR     APPENDECTOMY     CESAREAN SECTION     DILATATION & CURRETTAGE/HYSTEROSCOPY WITH RESECTOCOPE N/A 02/12/2024   Procedure: HYSTEROSCOPY WITH RESECTOCOPE;  Surgeon: Ozan, Jennifer, DO;  Location: [facility];  Service: Gynecology;  Laterality: N/A;  Polypectomy   left wrist surgery     right shoulder surgery      Medical History: Past Medical History:  Diagnosis Date   Asthma    Elevated liver enzymes    Fatty liver    High cholesterol    Hypertension    Sleep apnea    Vitamin D deficiency     Family History: Non contributory to the present illness  Social History: Social History  Socioeconomic History   Marital status: Single    Spouse name: Not on file   Number of children: Not on file   Years of education: Not on file   Highest education level: Not on file  Occupational History   Not on file  Tobacco Use   Smoking status: Former    Types: Cigarettes   Smokeless tobacco: Never  Vaping Use   Vaping status: Never Used  Substance and Sexual Activity   Alcohol use: Yes    Comment: weekends   Drug use: Never   Sexual activity: Yes    Birth control/protection: None  Other Topics Concern   Not on file  Social History Narrative   Not on file    Social Drivers of Health   Financial Resource Strain: Not on file  Food Insecurity: Not on file  Transportation Needs: Not on file  Physical Activity: Not on file  Stress: Not on file  Social Connections: Unknown (01/31/2023)   Received from [facility]   Social Network    Social Network: Not on file  Intimate Partner Violence: Unknown (01/31/2023)   Received from [facility]   HITS    Physically Hurt: Not on file    Insult or Talk Down To: Not on file    Threaten Physical Harm: Not on file    Scream or Curse: Not on file    Vital Signs: Blood pressure (!) 148/85, pulse 85, resp. rate 16, height 5' 8 (1.727 m), weight 231 lb (104.8 kg), SpO2 98%. Body mass index is 35.12 kg/m.    Examination: General Appearance: The patient is well-developed, well-nourished, and in no distress. Neck Circumference: 43 cm Skin: Gross inspection of skin unremarkable. Head: normocephalic, no gross deformities. Eyes: no gross deformities noted. ENT: ears appear grossly normal Neurologic: Alert and oriented. No involuntary movements.  STOP BANG RISK ASSESSMENT S (snore) Have you been told that you snore?     NO   T (tired) Are you often tired, fatigued, or sleepy during the day?   YES  O (obstruction) Do you stop breathing, choke, or gasp during sleep? NO   P (pressure) Do you have or are you being treated for high blood pressure? YES   B (BMI) Is your body index greater than 35 kg/m? YES   A (age) Are you 4 years old or older? YES   N (neck) Do you have a neck circumference greater than 16 inches?   YES   G (gender) Are you a female? NO   TOTAL STOP/BANG "YES" ANSWERS 5       A STOP-Bang score of 2 or less is considered low risk, and a score of 5 or more is high risk for having either moderate or severe OSA. For people who score 3 or 4, doctors may need to perform further assessment to determine how likely they are to have OSA.         EPWORTH SLEEPINESS SCALE:  Scale:   (0)= no chance of dozing; (1)= slight chance of dozing; (2)= moderate chance of dozing; (3)= high chance of dozing  Chance  Situtation    Sitting and reading: 0    Watching TV: 0    Sitting Inactive in public: 0    As a passenger in car: 0      Lying down to rest: 0    Sitting and talking: 0    Sitting quielty after lunch: 0    In a car, stopped in traffic: 0  TOTAL SCORE:   0 out of 24    SLEEP STUDIES:  HST (07/11/22) REI 17, SPO2 70%    CPAP COMPLIANCE DATA:  Date Range: 06/30/2023-06/28/2024  Average Daily Use: 7 hours 15 minutes  Median Use: 7 hours 56 minutes  Compliance for > 4 Hours: 77%  AHI: 4.2 respiratory events per hour  Days Used: 330/365 days  Mask Leak: 37.8  95th Percentile Pressure: 14.4         LABS: No results found for this or any previous visit (from the past 2160 hours).  Radiology: No results found.  No results found.  No results found.    Assessment and Plan: Patient Active Problem List   Diagnosis Date Noted   Endometrial polyp 02/12/2024   Postmenopausal bleeding 02/12/2024   Anxiety 06/11/2023   OSA (obstructive sleep apnea) 10/09/2022   Obesity (BMI 30.0-34.9) 10/09/2022   Essential hypertension 06/28/2022   Anxiety and depression 06/28/2022   1. OSA on CPAP (Primary) The patient does tolerate PAP and reports  benefit from PAP use. The patient was reminded how to clean equipment and advised to replace supplies routinely. The patient was also counselled on weight loss. The compliance is good. The AHI is 4.2.   OSA on cpap- controlled. Continue withcompliance with pap. CPAP continues to be medically necessary to treat this patient's OSA. F/u one year.    2. CPAP use counseling CPAP Counseling: had a lengthy discussion with the patient regarding the importance of PAP therapy in management of the sleep apnea. Patient appears to understand the risk factor reduction and also understands the risks associated  with untreated sleep apnea. Patient will try to make a good faith effort to remain compliant with therapy. Also instructed the patient on proper cleaning of the device including the water must be changed daily if possible and use of distilled water is preferred. Patient understands that the machine should be regularly cleaned with appropriate recommended cleaning solutions that do not damage the PAP machine for example given white vinegar and water rinses. Other methods such as ozone treatment may not be as good as these simple methods to achieve cleaning.   3. Essential hypertension Controlled with losartan, amlodipine, hydrochlorothiazide, continue.      General Counseling: I have discussed the findings of the evaluation and examination with Yoselyn.  I have also discussed any further diagnostic evaluation thatmay be needed or ordered today. Lynasia verbalizes understanding of the findings of todays visit. We also reviewed her medications today and discussed drug interactions and side effects including but not limited excessive drowsiness and altered mental states. We also discussed that there is always a risk not just to her but also people around her. she has been encouraged to call the office with any questions or concerns that should arise related to todays visit.  No orders of the defined types were placed in this encounter.       I have personally obtained a history, examined the patient, evaluated laboratory and imaging results, formulated the assessment and plan and placed orders. This patient was seen today by Lauraine Lay, PA-C in collaboration with Dr. Elfreda Bathe.   Elfreda DELENA Bathe, MD [facility] Diplomate ABMS Pulmonary Critical Care Medicine and Sleep Medicine

## 2024-06-30 ENCOUNTER — Ambulatory Visit (INDEPENDENT_AMBULATORY_CARE_PROVIDER_SITE_OTHER): Payer: Self-pay | Admitting: Internal Medicine

## 2024-06-30 VITALS — BP 148/85 | HR 85 | Resp 16 | Ht 68.0 in | Wt 231.0 lb

## 2024-06-30 DIAGNOSIS — I10 Essential (primary) hypertension: Secondary | ICD-10-CM

## 2024-06-30 DIAGNOSIS — G4733 Obstructive sleep apnea (adult) (pediatric): Secondary | ICD-10-CM | POA: Diagnosis not present

## 2024-06-30 DIAGNOSIS — Z7189 Other specified counseling: Secondary | ICD-10-CM | POA: Diagnosis not present

## 2024-06-30 NOTE — Patient Instructions (Signed)

## 2024-09-09 ENCOUNTER — Other Ambulatory Visit (HOSPITAL_COMMUNITY): Payer: Self-pay | Admitting: Nurse Practitioner

## 2024-09-09 DIAGNOSIS — Z13820 Encounter for screening for osteoporosis: Secondary | ICD-10-CM

## 2024-09-17 ENCOUNTER — Other Ambulatory Visit (HOSPITAL_COMMUNITY)

## 2024-10-02 ENCOUNTER — Ambulatory Visit (HOSPITAL_COMMUNITY)
Admission: RE | Admit: 2024-10-02 | Discharge: 2024-10-02 | Disposition: A | Discharge status: Home / Self Care | Payer: Self-pay | Source: Ambulatory Visit | Attending: Nurse Practitioner | Admitting: Nurse Practitioner

## 2024-10-02 DIAGNOSIS — Z13820 Encounter for screening for osteoporosis: Secondary | ICD-10-CM | POA: Diagnosis present

## 2024-11-03 ENCOUNTER — Other Ambulatory Visit (HOSPITAL_COMMUNITY): Payer: Self-pay | Admitting: *Deleted

## 2024-11-03 DIAGNOSIS — R0789 Other chest pain: Secondary | ICD-10-CM

## 2024-11-03 DIAGNOSIS — W1800XA Striking against unspecified object with subsequent fall, initial encounter: Secondary | ICD-10-CM

## 2024-11-03 DIAGNOSIS — T148XXA Other injury of unspecified body region, initial encounter: Secondary | ICD-10-CM

## 2024-11-03 DIAGNOSIS — R319 Hematuria, unspecified: Secondary | ICD-10-CM

## 2024-11-03 DIAGNOSIS — M546 Pain in thoracic spine: Secondary | ICD-10-CM

## 2024-11-05 ENCOUNTER — Emergency Department (HOSPITAL_COMMUNITY)
Admission: EM | Admit: 2024-11-05 | Discharge: 2024-11-05 | Discharge status: Against Medical Advice | Attending: Emergency Medicine | Admitting: Emergency Medicine

## 2024-11-05 ENCOUNTER — Emergency Department (HOSPITAL_COMMUNITY)

## 2024-11-05 ENCOUNTER — Encounter (HOSPITAL_COMMUNITY): Payer: Self-pay

## 2024-11-05 ENCOUNTER — Other Ambulatory Visit: Payer: Self-pay

## 2024-11-05 DIAGNOSIS — S29002A Unspecified injury of muscle and tendon of back wall of thorax, initial encounter: Secondary | ICD-10-CM | POA: Diagnosis present

## 2024-11-05 DIAGNOSIS — Y92002 Bathroom of unspecified non-institutional (private) residence single-family (private) house as the place of occurrence of the external cause: Secondary | ICD-10-CM | POA: Diagnosis not present

## 2024-11-05 DIAGNOSIS — K429 Umbilical hernia without obstruction or gangrene: Secondary | ICD-10-CM | POA: Diagnosis not present

## 2024-11-05 DIAGNOSIS — Z79899 Other long term (current) drug therapy: Secondary | ICD-10-CM | POA: Insufficient documentation

## 2024-11-05 DIAGNOSIS — W182XXA Fall in (into) shower or empty bathtub, initial encounter: Secondary | ICD-10-CM | POA: Insufficient documentation

## 2024-11-05 DIAGNOSIS — S22039A Unspecified fracture of third thoracic vertebra, initial encounter for closed fracture: Secondary | ICD-10-CM | POA: Diagnosis not present

## 2024-11-05 DIAGNOSIS — Z5329 Procedure and treatment not carried out because of patient's decision for other reasons: Secondary | ICD-10-CM | POA: Diagnosis not present

## 2024-11-05 DIAGNOSIS — N179 Acute kidney failure, unspecified: Secondary | ICD-10-CM

## 2024-11-05 DIAGNOSIS — I10 Essential (primary) hypertension: Secondary | ICD-10-CM | POA: Insufficient documentation

## 2024-11-05 DIAGNOSIS — E876 Hypokalemia: Secondary | ICD-10-CM | POA: Diagnosis not present

## 2024-11-05 DIAGNOSIS — S22030A Wedge compression fracture of third thoracic vertebra, initial encounter for closed fracture: Secondary | ICD-10-CM

## 2024-11-05 LAB — MAGNESIUM: Magnesium: 2.5 mg/dL — ABNORMAL HIGH (ref 1.7–2.4)

## 2024-11-05 LAB — I-STAT CHEM 8, ED
BUN: 46 mg/dL — ABNORMAL HIGH (ref 6–20)
Calcium, Ion: 1.17 mmol/L (ref 1.15–1.40)
Chloride: 90 mmol/L — ABNORMAL LOW (ref 98–111)
Creatinine, Ser: 2.8 mg/dL — ABNORMAL HIGH (ref 0.44–1.00)
Glucose, Bld: 101 mg/dL — ABNORMAL HIGH (ref 70–99)
HCT: 48 % — ABNORMAL HIGH (ref 36.0–46.0)
Hemoglobin: 16.3 g/dL — ABNORMAL HIGH (ref 12.0–15.0)
Potassium: 2.8 mmol/L — ABNORMAL LOW (ref 3.5–5.1)
Sodium: 130 mmol/L — ABNORMAL LOW (ref 135–145)
TCO2: 28 mmol/L (ref 22–32)

## 2024-11-05 LAB — COMPREHENSIVE METABOLIC PANEL WITH GFR
ALT: 30 U/L (ref 0–44)
AST: 33 U/L (ref 15–41)
Albumin: 4.5 g/dL (ref 3.5–5.0)
Alkaline Phosphatase: 104 U/L (ref 38–126)
Anion gap: 20 — ABNORMAL HIGH (ref 5–15)
BUN: 45 mg/dL — ABNORMAL HIGH (ref 6–20)
CO2: 24 mmol/L (ref 22–32)
Calcium: 10 mg/dL (ref 8.9–10.3)
Chloride: 90 mmol/L — ABNORMAL LOW (ref 98–111)
Creatinine, Ser: 2.57 mg/dL — ABNORMAL HIGH (ref 0.44–1.00)
GFR, Estimated: 21 mL/min — ABNORMAL LOW
Glucose, Bld: 95 mg/dL (ref 70–99)
Potassium: 2.6 mmol/L — CL (ref 3.5–5.1)
Sodium: 133 mmol/L — ABNORMAL LOW (ref 135–145)
Total Bilirubin: 0.4 mg/dL (ref 0.0–1.2)
Total Protein: 7.7 g/dL (ref 6.5–8.1)

## 2024-11-05 LAB — CBC WITH DIFFERENTIAL/PLATELET
Abs Immature Granulocytes: 0.05 K/uL (ref 0.00–0.07)
Basophils Absolute: 0 K/uL (ref 0.0–0.1)
Basophils Relative: 0 %
Eosinophils Absolute: 0.1 K/uL (ref 0.0–0.5)
Eosinophils Relative: 1 %
HCT: 31 % — ABNORMAL LOW (ref 36.0–46.0)
Hemoglobin: 10.5 g/dL — ABNORMAL LOW (ref 12.0–15.0)
Immature Granulocytes: 1 %
Lymphocytes Relative: 19 %
Lymphs Abs: 1.8 K/uL (ref 0.7–4.0)
MCH: 34.1 pg — ABNORMAL HIGH (ref 26.0–34.0)
MCHC: 33.9 g/dL (ref 30.0–36.0)
MCV: 100.6 fL — ABNORMAL HIGH (ref 80.0–100.0)
Monocytes Absolute: 0.5 K/uL (ref 0.1–1.0)
Monocytes Relative: 6 %
Neutro Abs: 6.9 K/uL (ref 1.7–7.7)
Neutrophils Relative %: 73 %
Platelets: 280 K/uL (ref 150–400)
RBC: 3.08 MIL/uL — ABNORMAL LOW (ref 3.87–5.11)
RDW: 12.5 % (ref 11.5–15.5)
WBC: 9.4 K/uL (ref 4.0–10.5)
nRBC: 0 % (ref 0.0–0.2)

## 2024-11-05 LAB — CK: Total CK: 282 U/L — ABNORMAL HIGH (ref 38–234)

## 2024-11-05 MED ORDER — FENTANYL CITRATE (PF) 100 MCG/2ML IJ SOLN
50.0000 ug | Freq: Once | INTRAMUSCULAR | Status: AC
  Administered 2024-11-05: 50 ug via INTRAVENOUS
  Filled 2024-11-05: qty 2

## 2024-11-05 MED ORDER — LACTATED RINGERS IV BOLUS
1000.0000 mL | Freq: Once | INTRAVENOUS | Status: AC
  Administered 2024-11-05: 1000 mL via INTRAVENOUS

## 2024-11-05 MED ORDER — OXYCODONE HCL 7.5 MG PO TABS: 7.5000 mg | ORAL_TABLET | Freq: Three times a day (TID) | ORAL | 0 refills | Status: AC | PRN

## 2024-11-05 MED ORDER — POTASSIUM CHLORIDE CRYS ER 20 MEQ PO TBCR
40.0000 meq | EXTENDED_RELEASE_TABLET | Freq: Once | ORAL | Status: AC
  Administered 2024-11-05: 40 meq via ORAL
  Filled 2024-11-05: qty 2

## 2024-11-05 MED ORDER — POTASSIUM CHLORIDE CRYS ER 20 MEQ PO TBCR: 20.0000 meq | EXTENDED_RELEASE_TABLET | Freq: Every day | ORAL | 0 refills | Status: AC

## 2024-11-05 MED ORDER — LIDOCAINE 5 % EX PTCH
1.0000 | MEDICATED_PATCH | CUTANEOUS | Status: DC
  Administered 2024-11-05: 1 via TRANSDERMAL
  Filled 2024-11-05: qty 1

## 2024-11-05 MED ORDER — POTASSIUM CHLORIDE 20 MEQ PO PACK
40.0000 meq | PACK | Freq: Once | ORAL | Status: AC
  Administered 2024-11-05: 40 meq via ORAL
  Filled 2024-11-05: qty 2

## 2024-11-05 NOTE — Discharge Instructions (Addendum)
 You are seen to the ER today because of pain after a fall.  Your CT scan shows that you do have a compression fracture at T3.  We have ordered a TLSO brace for you to wear until you follow-up with neurosurgery.  They want to see you in 2 weeks for repeat x-rays.  You do have a small fragment of bone near your spinal cord.  If you develop numbness tingling or weakness in extremities make sure you come to the ER right away. You are prescribed oxycodone  as needed for pain.  Do not drive or operate machinery when taking this, you should take MiraLAX daily when taking this to prevent constipation and make sure you drink plenty of water.  We did blood work today as well and that your kidney function is worsening.  I was able to see your labs for the past 2 months from your PCP, is slightly worse this month and it was a month ago.  I would recommend stopping the hydrochlorothiazide, following up with a kidney specialist and having your PCP recheck labs in a few days.  We recommended admission for this as well as your low potassium and after discussion that this could lead to worsened kidney function, need for dialysis or death, you decided you do not want to be admitted.  Your hemoglobin is also low showing anemia.  Follow-up with your PCP for this as well.  I am prescribing potassium since your potassium is very low today.  Stopping the hydrochlorothiazide will likely help with this as well.

## 2024-11-05 NOTE — ED Provider Notes (Addendum)
 " [place] EMERGENCY DEPARTMENT AT [facility] Provider Note   CSN: [id number] Arrival date & time: 11/05/24  1254     Patient presents with: Brianna Lopez is a 59 y.o. female.  To the ER today complaining of upper back pain after a fall at home 5 days ago.  She states she was wearing new shoes, while taking a step backwards in her bathroom she tripped and fell on her bathtub, she struck her upper back on the tub, she was having pain, went to her care and had x-rays that showed no acute findings.  She called her PCP for follow-up due to continued pain.  Her PCP ordered a CT scan, when they called to schedule this, patient decided to check in the ER for imaging due to the pain.  She states she has been taking oxycodone  and ibuprofen without relief.  He denies head injury or loss of consciousness, denies dizziness, denies numbness tingling or weakness, no neck pain.  She is not on blood thinners.  Patient has history of hypertension.  She states that she has been following with her PCP at [facility] as her kidney function has been elevated for the past 2 months, they are rechecking it.  Has not seen a nephrologist yet.    Fall       Prior to Admission medications  Medication Sig Start Date End Date Taking? Authorizing Provider  amLODipine (NORVASC) 5 MG tablet Take 5 mg by mouth daily.    [provider]  bumetanide (BUMEX) 0.5 MG tablet Take 0.5 mg by mouth daily as needed (Edema). 03/31/19   [provider]  clonazePAM (KLONOPIN) 0.5 MG tablet Take 0.5 mg by mouth daily as needed for anxiety.    [provider]  hydrochlorothiazide (HYDRODIURIL) 25 MG tablet Take 25 mg by mouth daily.    [provider]  ibuprofen (ADVIL) 200 MG tablet Take 200-800 mg by mouth every 6 (six) hours as needed for moderate pain (pain score 4-6).    [provider]  losartan (COZAAR) 100 MG tablet Take 100 mg by mouth daily.     [provider]  Probiotic Product (PROBIOTIC PO) Take 1 Dose by mouth daily.    [provider]  rosuvastatin (CRESTOR) 40 MG tablet Take 40 mg by mouth daily. 06/10/24   [provider]  venlafaxine XR (EFFEXOR-XR) 150 MG 24 hr capsule Take 150 mg by mouth daily.    [provider]  venlafaxine XR (EFFEXOR-XR) 75 MG 24 hr capsule Take 75 mg by mouth daily with breakfast.    [provider]  Vitamin D, Ergocalciferol, (DRISDOL) 1.25 MG (50000 UNIT) CAPS capsule Take 50,000 Units by mouth every 7 (seven) days.    [provider]    Allergies: Nitrofurantoin macrocrystal    Review of Systems  Updated Vital Signs BP 134/83 (BP Location: Right Arm)   Pulse 78   Temp 97.9 F (36.6 C) (Oral)   Resp 18   Ht 5' 8 (1.727 m)   Wt 104.8 kg   SpO2 98%   BMI 35.13 kg/m   Physical Exam Vitals and nursing note reviewed.  Constitutional:      General: She is not in acute distress.    Appearance: She is well-developed.  HENT:     Head: Normocephalic and atraumatic.  Eyes:     Conjunctiva/sclera: Conjunctivae normal.  Cardiovascular:     Rate and Rhythm: Normal rate and  regular rhythm.     Heart sounds: No murmur heard. Pulmonary:     Effort: Pulmonary effort is normal. No respiratory distress.     Breath sounds: Normal breath sounds.  Abdominal:     Palpations: Abdomen is soft.     Tenderness: There is no abdominal tenderness.  Musculoskeletal:        General: No swelling.     Cervical back: Normal and neck supple.     Lumbar back: Normal.       Back:  Skin:    General: Skin is warm and dry.     Capillary Refill: Capillary refill takes less than 2 seconds.  Neurological:     General: No focal deficit present.     Mental Status: She is alert and oriented to person, place, and time.     Sensory: No sensory deficit.     Motor: No weakness.  Psychiatric:        Mood and Affect: Mood normal.     (all labs ordered are  listed, but only abnormal results are displayed) Labs Reviewed  I-STAT CHEM 8, ED    EKG: None  Radiology: No results found.   Procedures   Medications Ordered in the ED  fentaNYL  (SUBLIMAZE ) injection 50 mcg (has no administration in time range)  lidocaine  (LIDODERM ) 5 % 1 patch (has no administration in time range)                                    Medical Decision Making Differential diagnosis includes but not limited to fracture, sprain, strain, contusion, other  ED course: Patient presents the ER today for evaluation of continued back pain after a fall several days ago at home.  This is a mechanical fall, no chest pain or shortness of breath.  PCP had ordered a CT chest on pelvis without contrast for further evaluation due to continued pain.  Patient instructed here rather than having outpatient test due to how much pain she was having despite oxycodone  and ibuprofen at home.  I ordered i-STAT before IV contrast and this showed hypokalemia and creatinine elevated.  Confirmatory labs were ordered and show that potassium is 2.6, sodium 133, chloride 90, BUN 45, creatinine 2.57 with anion gap of 20.  CK ordered and shows CK of 282, CBC with hemoglobin 10.5 no other acute abnormalities.  CTA chest abdomen pelvis ordered without contrast due to kidney dysfunction.  Noted to have a T3 vertebral body fracture with 75% height loss and 6 mm of retropulsion of the central canal as well as age-indeterminate compression deformity through superior endplate of T12 vertebral body with 25 to 50% of loss of height favor chronic, no other acute findings.  Radiology called me to notify of the T3 fracture.  Patient does have point tenderness in this area.  I consulted neurosurgery and spoke with Duwaine Beck, NP-given patient has no acute neurologic deficits she recommended TLSO brace and follow-up in 2 weeks for repeat x-rays.  Patient informed of this, advised to wear the TLSO brace at all times,  advised to come back if she has any new numbness tingling or weakness.  She resumed taking oxycodone  5 mg, increase to 7.5 mg.  She was given fentanyl  in the ED with significant improvement in her pain.  Also repleted potassium here orally and gave IV fluids due to slightly worsening renal function.  Patient's renal function, she states  she has been following with her PCP because it has been getting worse for the past couple of months.  I was able to review her PCP chart from care everywhere and see that in December she had a creatinine just over 2.  Schedule patient that I would recommend admission for the hypokalemia and worsening kidney function.  Patient adamantly wants to leave, we discussed that she risk for worsening renal function, need for dialysis as well as risk of death due to electrolyte derangement, renal failure.  She verbalized understanding and still wants to leave, she is willing to sign out AGAINST MEDICAL ADVICE and understands that she can come back if she changes her mind or her symptoms worsen.  I encouraged her to follow-up with nephrology as well.  She was advised to avoid NSAIDs such as ibuprofen, Aleve and aspirin which can worsen renal function, drink plenty of fluids, and stop her HCTZ and have her PCP recheck her labs to recheck kidney function and potassium in the next couple of days.  She verbalized understanding is agreeable with this.  Amount and/or Complexity of Data Reviewed Labs: ordered. Radiology: ordered.  Risk Prescription drug management.        Final diagnoses:  None    ED Discharge Orders     None          Suellen Sherran DELENA DEVONNA 11/05/24 2143    Francesca Elsie CROME, MD 11/05/24 2204    Suellen Sherran A, PA-C 11/05/24 2343    Francesca Elsie CROME, MD 11/08/24 [phone]  "

## 2024-11-05 NOTE — ED Triage Notes (Signed)
 Pt arrived via POV from home c/o pain in her upper back from a fall that occurred at home Friday night. Pt reports going to an Urgent Care in [facility] where she had an xray performed and was prescribed 800mg  Ibuprofen and some oxycodone  and a work note for 3 days. Pt reports pain has not improved and was advised by her doctor to go to [facility] for a CT Scan.

## 2024-11-05 NOTE — Progress Notes (Signed)
 Orthopedic Tech Progress Note Patient Details:  Brianna Lopez [facility] 02-29-1966 [id number]  Ortho Devices Type of Ortho Device: Thoracolumbar corset (TLSO) Ortho Device/Splint Location: Back Ortho Device/Splint Interventions: Ordered    Called TLSO Brace into [facility].  Brianna Lopez [name] 11/05/2024, 7:04 PM

## 2024-11-21 ENCOUNTER — Other Ambulatory Visit (HOSPITAL_COMMUNITY): Payer: Self-pay | Admitting: Neurosurgery

## 2024-11-21 DIAGNOSIS — S22030G Wedge compression fracture of third thoracic vertebra, subsequent encounter for fracture with delayed healing: Secondary | ICD-10-CM
